# Patient Record
Sex: Female | Born: 1980 | Race: White | Hispanic: No | Marital: Married | Smoking: Former smoker
Health system: Southern US, Community
[De-identification: ages and names within clinical notes are randomized; demographics above are authoritative.]

## PROBLEM LIST (undated history)

## (undated) DIAGNOSIS — I8390 Asymptomatic varicose veins of unspecified lower extremity: Secondary | ICD-10-CM

## (undated) DIAGNOSIS — F329 Major depressive disorder, single episode, unspecified: Secondary | ICD-10-CM

## (undated) DIAGNOSIS — M797 Fibromyalgia: Secondary | ICD-10-CM

## (undated) DIAGNOSIS — R160 Hepatomegaly, not elsewhere classified: Secondary | ICD-10-CM

## (undated) DIAGNOSIS — C801 Malignant (primary) neoplasm, unspecified: Secondary | ICD-10-CM

## (undated) DIAGNOSIS — F319 Bipolar disorder, unspecified: Secondary | ICD-10-CM

## (undated) DIAGNOSIS — F32A Depression, unspecified: Secondary | ICD-10-CM

## (undated) HISTORY — DX: Fibromyalgia: M79.7

## (undated) HISTORY — DX: Hepatomegaly, not elsewhere classified: R16.0

## (undated) HISTORY — PX: BIOPSY THYROID: PRO38

## (undated) HISTORY — DX: Asymptomatic varicose veins of unspecified lower extremity: I83.90

## (undated) HISTORY — DX: Depression, unspecified: F32.A

## (undated) HISTORY — PX: BLADDER SURGERY: SHX569

## (undated) HISTORY — PX: TUBAL LIGATION: SHX77

## (undated) HISTORY — PX: DILATION AND CURETTAGE OF UTERUS: SHX78

## (undated) HISTORY — DX: Major depressive disorder, single episode, unspecified: F32.9

## (undated) SURGERY — Surgical Case
Anesthesia: *Unknown

---

## 2000-11-05 ENCOUNTER — Emergency Department (HOSPITAL_COMMUNITY): Admission: EM | Admit: 2000-11-05 | Discharge: 2000-11-05 | Payer: Self-pay | Admitting: Emergency Medicine

## 2001-01-21 ENCOUNTER — Encounter: Admission: RE | Admit: 2001-01-21 | Discharge: 2001-01-21 | Payer: Self-pay | Admitting: Family Medicine

## 2001-01-21 ENCOUNTER — Encounter: Payer: Self-pay | Admitting: Family Medicine

## 2002-08-10 ENCOUNTER — Other Ambulatory Visit: Admission: RE | Admit: 2002-08-10 | Discharge: 2002-08-10 | Payer: Self-pay | Admitting: *Deleted

## 2002-08-10 ENCOUNTER — Other Ambulatory Visit: Admission: RE | Admit: 2002-08-10 | Discharge: 2002-08-10 | Payer: Self-pay | Admitting: Obstetrics and Gynecology

## 2003-09-03 ENCOUNTER — Other Ambulatory Visit: Admission: RE | Admit: 2003-09-03 | Discharge: 2003-09-03 | Payer: Self-pay | Admitting: Obstetrics and Gynecology

## 2003-10-11 ENCOUNTER — Inpatient Hospital Stay: Admission: AD | Admit: 2003-10-11 | Discharge: 2003-10-11 | Payer: Self-pay | Admitting: Obstetrics and Gynecology

## 2003-10-18 ENCOUNTER — Ambulatory Visit (HOSPITAL_COMMUNITY): Admission: RE | Admit: 2003-10-18 | Discharge: 2003-10-18 | Payer: Self-pay | Admitting: Obstetrics and Gynecology

## 2003-11-26 ENCOUNTER — Encounter (INDEPENDENT_AMBULATORY_CARE_PROVIDER_SITE_OTHER): Payer: Self-pay | Admitting: Specialist

## 2003-11-26 ENCOUNTER — Ambulatory Visit (HOSPITAL_COMMUNITY): Admission: RE | Admit: 2003-11-26 | Discharge: 2003-11-26 | Payer: Self-pay | Admitting: Obstetrics and Gynecology

## 2004-08-11 ENCOUNTER — Inpatient Hospital Stay (HOSPITAL_COMMUNITY): Admission: AD | Admit: 2004-08-11 | Discharge: 2004-08-11 | Payer: Self-pay | Admitting: Obstetrics and Gynecology

## 2004-12-24 ENCOUNTER — Inpatient Hospital Stay (HOSPITAL_COMMUNITY): Admission: AD | Admit: 2004-12-24 | Discharge: 2004-12-24 | Payer: Self-pay | Admitting: Obstetrics and Gynecology

## 2005-01-04 ENCOUNTER — Inpatient Hospital Stay (HOSPITAL_COMMUNITY): Admission: AD | Admit: 2005-01-04 | Discharge: 2005-01-04 | Payer: Self-pay | Admitting: Obstetrics and Gynecology

## 2005-01-08 ENCOUNTER — Inpatient Hospital Stay (HOSPITAL_COMMUNITY): Admission: AD | Admit: 2005-01-08 | Discharge: 2005-01-08 | Payer: Self-pay | Admitting: Obstetrics and Gynecology

## 2005-01-09 ENCOUNTER — Inpatient Hospital Stay (HOSPITAL_COMMUNITY): Admission: AD | Admit: 2005-01-09 | Discharge: 2005-01-13 | Payer: Self-pay | Admitting: Obstetrics and Gynecology

## 2005-01-10 ENCOUNTER — Encounter (INDEPENDENT_AMBULATORY_CARE_PROVIDER_SITE_OTHER): Payer: Self-pay | Admitting: Specialist

## 2005-01-14 ENCOUNTER — Inpatient Hospital Stay (HOSPITAL_COMMUNITY): Admission: AD | Admit: 2005-01-14 | Discharge: 2005-01-14 | Payer: Self-pay | Admitting: Obstetrics and Gynecology

## 2007-03-18 ENCOUNTER — Encounter: Admission: RE | Admit: 2007-03-18 | Discharge: 2007-03-18 | Payer: Self-pay | Admitting: Obstetrics and Gynecology

## 2007-05-12 ENCOUNTER — Encounter (INDEPENDENT_AMBULATORY_CARE_PROVIDER_SITE_OTHER): Payer: Self-pay | Admitting: Obstetrics and Gynecology

## 2007-05-12 ENCOUNTER — Inpatient Hospital Stay (HOSPITAL_COMMUNITY): Admission: AD | Admit: 2007-05-12 | Discharge: 2007-05-15 | Payer: Self-pay | Admitting: Obstetrics and Gynecology

## 2008-11-14 ENCOUNTER — Ambulatory Visit: Payer: Self-pay | Admitting: Vascular Surgery

## 2009-03-01 ENCOUNTER — Ambulatory Visit: Payer: Self-pay | Admitting: Vascular Surgery

## 2009-06-12 ENCOUNTER — Ambulatory Visit: Payer: Self-pay | Admitting: Vascular Surgery

## 2009-06-12 HISTORY — PX: ENDOVENOUS ABLATION SAPHENOUS VEIN W/ LASER: SUR449

## 2009-06-19 ENCOUNTER — Ambulatory Visit: Payer: Self-pay | Admitting: Vascular Surgery

## 2009-07-10 ENCOUNTER — Ambulatory Visit: Payer: Self-pay | Admitting: Vascular Surgery

## 2009-07-10 HISTORY — PX: ENDOVENOUS ABLATION SAPHENOUS VEIN W/ LASER: SUR449

## 2009-07-17 ENCOUNTER — Ambulatory Visit: Payer: Self-pay | Admitting: Vascular Surgery

## 2010-02-23 ENCOUNTER — Encounter: Payer: Self-pay | Admitting: Obstetrics and Gynecology

## 2010-06-17 NOTE — Assessment & Plan Note (Signed)
OFFICE VISIT   Andrea Gallegos  DOB:  03/20/1980                                       03/01/2009  BMWUX#:[id number]   The patient presents today for continued followup of her severe venous  pathology.  She has been seen by myself in October for evaluation of  bilateral lower extremity saphenous vein varicosities.  She has worn  graduated compression garments for 3 months and reports these are giving  her no improvement in her pain.  She also elevates her legs when  possible and also takes ibuprofen t.i.d. as needed for pain as well.  She reports that she works at a job in Sales promotion account executive and requires a prolonged  period of sitting and standing that is very difficult due to leg pain.  She also reports that prolonged standing is difficult and painful with  the care of her 2 small children and also has had to stop cardiac and  weight strength training exercise due to leg pain.   PHYSICAL EXAMINATION:  Physical exam is unchanged.  Blood pressure today  is 127/86, pulse 100, respirations 18.  She does have 2+ dorsalis pedis  pulses bilaterally.  She has marked saphenous vein varicosities in both  medial distal thighs onto her calves.  She had undergone prior duplex  showing gross reflux in her saphenous veins bilaterally.  I have  recommended staged bilateral laser ablation of her great saphenous vein  and stab phlebectomy of her tributary varicosities for relief of her  symptoms.  She reports that the right leg is slightly more severe than  her left, and so we will proceed with this leg first.  She understands  the procedure is an outpatient procedure under local anesthesia.  We  will schedule her at her convenience.     Larina Earthly, M.D.  Electronically Signed   TFE/MEDQ  D:  03/01/2009  T:  03/04/2009  Job:  [id number]   cc:   Loyal Jacobson, M.D.

## 2010-06-17 NOTE — Procedures (Signed)
DUPLEX DEEP VENOUS EXAM - LOWER EXTREMITY   INDICATION:  Follow up left great saphenous vein ablation.   HISTORY:  Edema:  No  Trauma/Surgery:  05/11 left great saphenous vein ablation  Pain:  Yes  PE:  No  Previous DVT:  No  Anticoagulants:  No  Other:   DUPLEX EXAM:                CFV   SFV   PopV  PTV    GSV                R  L  R  L  R  L  R   L  R  L  Thrombosis    0  0     0     0      0     +  Spontaneous   +  +     +     +      +     0  Phasic        +  +     +     +      +     0  Augmentation  +  +     +     +      +     0  Compressible  +  +     +     +      +     0  Competent     +  +     +     +      +     0   Legend:  + - yes  o - no  p - partial  D - decreased   IMPRESSION:  There is no evidence of deep venous thrombosis in the left  leg.  There appears to be thrombus noted in the left great saphenous  vein proximal to the level of the knee.    _____________________________  Janetta Hora Fields, MD   CB/MEDQ  D:  06/20/2009  T:  06/20/2009  Job:  [id number]

## 2010-06-17 NOTE — Op Note (Signed)
NAMELaray Gallegos NO.:  [id number]   MEDICAL RECORD NO.:  [id number]          PATIENT TYPE:  INP   LOCATION:  9198                          [facility]ILITY:  [facility]   PHYSICIAN:  Lenoard Aden, M.D.DATE OF BIRTH:  October 22, 1980   DATE OF PROCEDURE:  05/12/2007  DATE OF DISCHARGE:                               OPERATIVE REPORT   PREOPERATIVE DIAGNOSIS:  38 week intrauterine pregnancy, poorly  controlled gestational diabetes, previous cesarean section, desire for  elective sterilization, polyhydramnios.   POSTOPERATIVE DIAGNOSIS:  38 week intrauterine pregnancy, poorly  controlled gestational diabetes, previous cesarean section, desire for  elective sterilization, polyhydramnios.   PROCEDURE:  Repeat low segment transverse cesarean section and tubal  ligation.   SURGEON:  Lenoard Aden, M.D.   ASSISTANT:  Marlinda Mike, C.N.M.   ANESTHESIA:  Spinal by Dr. Pamalee Leyden.   ESTIMATED BLOOD LOSS:  1000 mL.   COMPLICATIONS:  None.   DRAINS:  Foley.   COUNTS:  Correct.   DISPOSITION:  Patient to the recovery room in good condition.   FINDINGS:  Full term living female, Apgars 8/.  Normal tubes, normal  ovaries.   BRIEF OPERATIVE NOTE:  After being apprised of the risks of anesthesia,  infection, bleeding, injury to abdominal organs, need for repair,  delayed versus immediate complications to include bowel and bladder  injury, the patient was brought to the operating room where she was  administered spinal anesthetic without complications, prepped and draped  in the usual sterile fashion.  A Foley catheter was placed.  After  achieving adequate anesthesia with dilute Marcaine solution, A  Pfannenstiel skin incision was made with the scalpel and carried down to  the fascia which is nicked in the midline and extended transversely  using Mayo scissors.  The rectus muscles were separated in the midline,  the peritoneum entered sharply, and bladder blade placed.  Visceroperitoneum was scored sharply off the lower uterine segment.  A  Kerr hysterotomy incision was made, atraumatic delivery, occiput  transverse position, full term living female handed to the pediatricians  in attendance.  Apgars 8/9, cord blood collected.  Placenta delivered  manually intact. Three vessel cord noted.  The uterus exteriorized,  closed in one running layer of 0 Monocryl suture interrupted placed in  the midline.  The right and left tube traced out to the fimbriated end,  ampullary isthmic portion of tube identified, avascular portion of the  mesosalpinx cauterized creating a window, 0 ties were placed proximally  and distally.  Good hemostasis is noted.  The tubal lumens were  visualized and cauterized.  The tubal segment removed.  The same  procedure done on the left tube is done on the right tube. Tubal  segments are sent to pathology.  Good hemostasis was noted.  The uterine incision was reinspected and  found to be hemostatic.  The bladder flap inspected and found to be  hemostatic.  Irrigation accomplished.  The fascia was closed using 0  Monocryl suture and the skin closed using staples.  The patient  tolerated the procedure well and transferred to recovery  in good  condition.      Lenoard Aden, M.D.  Electronically Signed     RJT/MEDQ  D:  05/12/2007  T:  05/12/2007  Job:  [id number]

## 2010-06-17 NOTE — Procedures (Signed)
DUPLEX DEEP VENOUS EXAM - LOWER EXTREMITY   INDICATION:  Right greater saphenous vein laser procedure and  phlebectomies.   HISTORY:  Edema:  No.  Trauma/Surgery:  On 07/20/09, right greater saphenous vein laser  procedure and phlebectomies.  Pain:  Yes.  PE:  No.  Previous DVT:  No.  Anticoagulants:  No.  Other:   DUPLEX EXAM:                CFV   SFV   PopV  PTV    GSV                R  L  R  L  R  L  R   L  R  L  Thrombosis    o  o  o     o     o  Spontaneous   +  +  +     +     +  Phasic        +  +  +     +     +  Augmentation  +  +  +     +     +  Compressible  +  +  +     +     +  Competent     +  +  +     +     +   Legend:  + - yes  o - no  p - partial  D - decreased   IMPRESSION:  1. The right leg is negative for deep venous thrombosis.  2. Right greater saphenous vein has been lasered closed.  Area of      phlebectomies also appear to be closed.    _____________________________  Larina Earthly, M.D.   NT/MEDQ  D:  07/17/2009  T:  07/17/2009  Job:  [id number]

## 2010-06-17 NOTE — Assessment & Plan Note (Signed)
OFFICE VISIT   Andrea Gallegos A  DOB:  1980/07/22                                       07/10/2009  EAVWU#:[id number]   The patient presents today for right leg great saphenous vein laser  ablation and stab phlebectomy of multiple tributaries through her thigh  and calf.  She had undergone a similar treatment in her left leg on  05/11.  She had no complications following the procedure and is here for  her planned staged second leg.  She underwent the procedure without  immediate complication and was discharged home.  She will be seen in 1  week for repeat duplex to rule out any evidence of DVT and to confirm  ablation of her saphenous vein.     Larina Earthly, M.D.  Electronically Signed   TFE/MEDQ  D:  07/10/2009  T:  07/11/2009  Job:  [id number]   cc:   Dr Loyal Jacobson

## 2010-06-17 NOTE — Procedures (Signed)
LOWER EXTREMITY VENOUS REFLUX EXAM   INDICATION:  Bilateral lower extremity swelling and pain.   EXAM:  Using color-flow imaging and pulse Doppler spectral analysis, the  bilateral common femoral, superficial femoral, popliteal, posterior  tibial, greater and lesser saphenous veins are evaluated.  There is not  evidence suggesting deep venous insufficiency in the bilateral lower  extremities.   The bilateral saphenofemoral junction is not competent with reflux of  >500 milliseconds.  The bilateral GSV is not competent with reflux of  >500 milliseconds, with the caliber as described below.   The bilateral proximal short saphenous vein demonstrates incompetency.   GSV Diameter (used if found to be incompetent only)                                            Right    Left  Proximal Greater Saphenous Vein           1.09 cm  1.31 cm  Proximal-to-mid-thigh                     0.62 cm  0.64 cm  Mid thigh                                 0.57 cm  0.93 cm  Mid-distal thigh                          cm       cm  Distal thigh                              0.69 cm  0.66 cm  Knee                                      0.49 cm  0.76 cm   IMPRESSION:  1. The bilateral greater saphenous vein reflux with >500 milliseconds      is identified with the caliber ranging from 1.09 cm to 0.49 cm on      the right and 1.31 cm to 0.76 cm on the left from the knee to      groin.  2. The bilateral greater saphenous veins are not aneurysmal.  3. The bilateral greater saphenous veins are not tortuous.  4. The deep venous system is competent.  5. The bilateral lesser saphenous veins are not competent with reflux      of >500 milliseconds, with the right extending into a cranial vein,      which becomes a tortuous varicosity.  6. Incompetent perforator veins on the right medial measuring 0.29.      On the left medial, 0.31cm and posterior 0.38 cm.        ___________________________________________  Larina Earthly, M.D.   CJ/MEDQ  D:  11/14/2008  T:  11/14/2008  Job:  [id number]

## 2010-06-17 NOTE — Consult Note (Signed)
NEW PATIENT CONSULTATION   Andrea Gallegos A  DOB:  May 25, 1980                                       11/14/2008  [id number]#:[id number]   The patient presents today for evaluation of bilateral venous  varicosities and venous hypertension.  She is a very pleasant 30-year-  old white female with progressive changes of marked saphenous vein  tributary varicosities in both lower extremities.  She reports that  these began occurring around the time of her initial pregnancy and has  been progressive over the last several years.  She reports bilateral  pain.  This occurs with standing and with walking.  She reports that  this is somewhat worse on the left leg than on the right leg with a  generalized aching throughout this area.  She also has pain specifically  over the varicosities themselves.  She does not have any history of deep  venous thrombosis, superficial thrombophlebitis or bleeding.   PAST MEDICAL HISTORY:  Significant for gestational diabetes, does have  history of melanoma.   SOCIAL HISTORY:  She is married with 2 children.  She does smoke 1 pack  of cigarettes per day and does have several alcohol drinks per week.   REVIEW OF SYSTEMS:  Her weight is 215 pounds.  She is 5 feet 7 inches  tall.  Her review of systems is negative for cardiac, pulmonary, GI or  GU difficulties.  She does have pain with walking in her legs related to  the varicosities.  She has no neuro disorders.   PHYSICAL EXAM:  A well-developed, well-nourished white female appearing  stated age of 36.  Her radial and dorsalis pedis pulses are 2+  bilaterally.  Blood pressure is 123/30, heart rate is 82, respirations  18.  Her lower extremities are noted for marked saphenous vein  varicosities in her medial thigh and medial calf.  These are quite large  bilaterally.   She underwent noninvasive vascular laboratory studies in our office.  This reveals no evidence of DVT and no evidence of deep  vein reflux.  She does have marked reflux in her great and small saphenous veins  bilaterally.  I discussed this at length with the patient.  It appears  that her painful varicosities are arising off her great saphenous and  not from her small saphenous although there are some small  communications.  She had worn compression garments in the past but has  not worn these recently.  We have fitted her today for thigh-high 20-30  mmHg graduated compression stockings and instructed her on their use.  We will see her back in 3 months to determine her result from this  treatment.  I did discuss the option of laser ablation and stab  phlebectomy for relief of symptoms should this not be treated  effectively with compression.  She will see [place] in 3 months.   Larina Earthly, M.D.  Electronically Signed   TFE/MEDQ  D:  11/14/2008  T:  11/15/2008  Job:  [id number]   cc:   Dr Loyal Jacobson

## 2010-06-17 NOTE — Assessment & Plan Note (Signed)
OFFICE VISIT   Andrea Gallegos  DOB:  June 01, 1980                                       06/12/2009  ZOXWR#:[id number]   The patient presents today for the first of her staged bilateral laser  ablation and stab phlebectomy for painful varicosities of her lower  extremities.  She reports that her left leg is somewhat more sensitive  than the right and therefore we proceeded with the left leg laser  ablation of her great saphenous vein from just below her knee to her  saphenofemoral junction and stab phlebectomy and multiple tributary  varicosities in her thigh, knee region and calf.  She had no immediate  complications and will be seen again in 1 week with repeat ultrasound.  Following recovery of the discomfort from this procedure she will have  staged similar treatment in her right leg.     Larina Earthly, M.D.  Electronically Signed   TFE/MEDQ  D:  06/12/2009  T:  06/13/2009  Job:  [id number]

## 2010-06-20 NOTE — Discharge Summary (Signed)
NAMELaray Gallegos NO.:  [id number]   MEDICAL RECORD NO.:  [id number]          PATIENT TYPE:  INP   LOCATION:  9108                          FACILITY:  [facility]   PHYSICIAN:  Rudy Jew. Ashley Royalty, M.D.DATE OF BIRTH:  December 09, 1980   DATE OF ADMISSION:  01/09/2005  DATE OF DISCHARGE:  01/13/2005                                 DISCHARGE SUMMARY   DISCHARGE DIAGNOSES:  1.  Intrauterine pregnancy at [redacted] weeks gestation, delivered.  2.  History of genital herpes.  3.  History of melanoma.  4.  History of migraines.  5.  Anxiety disorder.  6.  Term birth living child, vertex.  7.  Meconium stained amniotic fluid.  8.  Arrest disorder of descent.   OPERATIONS AND SPECIAL PROCEDURES:  Primary low-transverse cesarean section.   CONSULTATIONS:  None.   DISCHARGE MEDICATIONS:  1.  Percocet.  2.  Motrin 600 mg q.i.d.  3.  Chromagen.  4.  Aristocort 0.5%.   HISTORY AND PHYSICAL:  This is a 30 year old gravida 2, para 0, AB 1, EDC  January 02, 2005 at [redacted] weeks gestation.  Prenatal care was initially at  [facility].  The patient switched to [facility]  approximately July 17, 2004.  Risk factors during the pregnancy included  genital herpes, migraine headaches, history of melanoma.  The patient was  admitted for induction secondary to post dates gestation.  For the remainder  of the history and physical, please see chart.   HOSPITAL COURSE:  The patient was admitted to [facility].  Admission laboratory studies were drawn.  The patient went on  to labor on January 10, 2005.  She developed an arrest disorder of descent.  Hence, she was taken to the operating room and underwent a primary low-  transverse cesarean section.  The procedure yielded a 7 pound 15 ounce female,  Apgars 9 at one minute and 9 at five minutes, and was sent to the newborn  nursery.  Arterial cord pH was 7.32.  Delivery was accomplished by Dr. Sylvester Harder.  The patient's postpartum course was benign, save for an  asymptomatic anemia.  She was discharged home on the aforementioned  medications on January 13, 2005 afebrile and in satisfactory condition.   The patient is to return to [facility] in 4 to 6  weeks for postpartum evaluation.  The patient was discharged home.      James A. Ashley Royalty, M.D.  Electronically Signed     JAM/MEDQ  D:  03/11/2005  T:  03/11/2005  Job:  [id number]

## 2010-06-20 NOTE — H&P (Signed)
NAMELaray Gallegos NO.:  [id number]   MEDICAL RECORD NO.:  [id number]          PATIENT TYPE:  AMB   LOCATION:  SDC                           [facility]ILITY[facility]  [facility]   PHYSICIAN:  Andrea Gallegos, M.D.   DATE OF BIRTH:  12/13/1980   DATE OF ADMISSION:  DATE OF DISCHARGE:                                HISTORY & PHYSICAL   PREOPERATIVE DIAGNOSIS:  Missed abortion.   HISTORY OF PRESENT ILLNESS:  This is a 30 year old gravida 1, para 0, white  female with a due date of Jun 17, 2004 established by 7-week ultrasound who  presented to the office at [redacted] weeks gestation for a routine OB visit and was  found to have a nonviable intra-uterine pregnancy.  The patient presents  today for surgical management of missed AB.  She denies any vaginal bleeding  or cramping.   PAST MEDICAL HISTORY:  1.  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  2.  History of migraine.  3.  History of recurrent urinary tract infection.  4.  Anxiety.   GYNECOLOGIC HISTORY:  Positive history of HSV, positive abnormal Pap smear  but no surgical intervention needed.   SURGICAL HISTORY:  Bladder surgery in 1987.   OBSTETRIC HISTORY:  Gravida 1, para 0.  The patient had marine IUD placed in  August of 2005, spontaneously conceived.  IUD was removed September of 2005.   FAMILY HISTORY:  Noncontributory.  No history of congenital anomalies,  bleeding or clotting disorders.  Positive history of skin cancer.   PHYSICAL EXAMINATION:  VITAL SIGNS: Afebrile.  Vital signs are stable.  GENERAL:  She is a well-developed, well-nourished white female in no  apparent distress.  HEART:  A regular rate and rhythm.  LUNGS:  Clear to auscultation bilaterally.  ABDOMEN:  Soft, nontender, nondistended with no palpable masses.  BREASTS:  Symmetric, nontender.  No masses.  No lymphadenopathy.  No  discharge from the nipple.  SKIN:  She has three scars on her chest from her recent mole excision.  PELVIC:  External genitalia are within normal limits.  Bladder and urethra  within normal limits.  Vagina is pink, moist and rugated.  Cervix is without  lesions.  No cervical motion tenderness, is visibly closed.  Bimanual exam:  Uterus is anteverted about 8-10 weeks in size, nontender.  Adnexa are  nontender with no palpable masses.   ULTRASOUND:  Ultrasound on October 31, 2003 revealed at that time a viable  intra-uterine pregnancy at 7-weeks gestation.  Ultrasound performed on  November 22, 2003 when the patient should have been 10-weeks gestation  revealed a fetal pole measuring at only 8+ weeks gestation with no cardiac  activity identified.  Her blood type is O+, antibody screen negative.   ASSESSMENT:  A 30 year old gravida 1, para 0, white female with first  trimester missed AB, desires surgical management.   PLAN:  The patient has been counseled on the risks, benefits, and  alternatives of D&E, reviewing the risk of bleeding requiring transfusion,  infection which could cause infertility, injury to uterus or uterine  perforation which could require further surgery and cause complications with  pregnancy in the future or infertility, anesthetic-related complications and  the patient voices an understanding of all these risks and agrees to  proceed.  All questions have been answered and consent has been obtained.      JW/MEDQ  D:  11/25/2003  T:  11/25/2003  Job:  [id number]

## 2010-06-20 NOTE — H&P (Signed)
NAMELaray Gallegos NO.:  [id number]   MEDICAL RECORD NO.:  [id number]          PATIENT TYPE:  INP   LOCATION:  NA                            [facility]   PHYSICIAN:  James A. Ashley Royalty, M.D.DATE OF BIRTH:  1980-02-03   DATE OF ADMISSION:  01/09/2005  DATE OF DISCHARGE:                                HISTORY & PHYSICAL   Please send this dictation to labor and delivery at [facility], as the  patient is coming in at approximately 9 p.m. this evening.   This is a 30 year old gravida 2, para 0, AB 1, EDC January 02, 2005, 41  weeks by dates.  Prenatal care was initially at [facility].  The  patient switched to [facility] and [facility]s approximately July 17, 2004.  Risk factors during this pregnancy include genital herpes  (confidential), migraine headaches, history of melanoma.  The patient is  admitted for induction secondary to postdates gestation.   MEDICATIONS:  1.  Zoloft 25 mg daily.  2.  Prenatal vitamins.  3.  Valtrex 500 mg daily.   PAST MEDICAL HISTORY:  As above.   SURGICAL HISTORY:  Negative.   ALLERGIES:  Negative.   REVIEW OF SYSTEMS:  Noncontributory.   PHYSICAL EXAMINATION:  GENERAL:  Well-developed, well-nourished, pleasant,  white female in no acute distress.  VITAL SIGNS:  Afebrile, vital signs stable.  CHEST:  Lungs are clear.  CARDIAC:  Regular rate and rhythm.  ABDOMEN:  Gravid with a term fundal height.  Fetal heart tones are  auscultated.  MUSCULOSKELETAL:  No CVA tenderness.  PELVIC:  Deferred until labor and delivery admission.   IMPRESSION:  1.  Intrauterine pregnancy at [redacted] weeks gestation.  2.  History of genital herpes (culture proven) - confidential.  3.  History of melanoma.  4.  History of migraines.  5.  Anxiety disorder.   PLAN:  1.  Admit.  2.  Induction of labor.      James A. Ashley Royalty, M.D.  Electronically Signed     JAM/MEDQ  D:  01/09/2005  T:  01/09/2005  Job:  [id number]

## 2010-06-20 NOTE — Op Note (Signed)
NAMELaray Gallegos NO.:  [id number]   MEDICAL RECORD NO.:  [id number]          PATIENT TYPE:  INP   LOCATION:  9108                          FACILITY:  [facility]   PHYSICIAN:  Andrea Gallegos, M.D.DATE OF BIRTH:  01-15-1981   DATE OF PROCEDURE:  01/10/2005  DATE OF DISCHARGE:                                 OPERATIVE REPORT   PREOPERATIVE DIAGNOSIS:  1.  Intrauterine pregnancy at [redacted] weeks gestation.  2.  Arrest disorder of descent in labor.  3.  Meconium-stained amniotic fluid.   POSTOPERATIVE DIAGNOSES:  1.  Intrauterine pregnancy at [redacted] weeks gestation.  2.  Arrest disorder of descent in labor.  3.  Meconium-stained amniotic fluid.   PROCEDURE:  Primary low transverse cesarean section.   SURGEON:  Andrea Gallegos, M.D.   ANESTHESIA:  Epidural   FINDINGS:  7 pounds 15 ounces female Apgars 9 at 1-minute, 9 at 5 minutes sent  to the newborn nursery. Arterial cord pH 7.32.   ESTIMATED BLOOD LOSS:  600 mL.   COMPLICATIONS:  None.   PACKS DRAINS:  Foley.   Needle, sponge, and instrument counts correct x2.   PROCEDURE:  The patient is taken to the operating room, placed in the  dorsosupine position. She was prepped and draped in usual manner for  abdominal surgery. Foley catheter had been previously placed. Surgical  levels of anesthesia were verified.   The Pfannenstiel incision was made at the level of the fascia. The fascia  was nicked with a knife, incised transversely with Mayo scissors. The  underlying rectus muscles were separated from the fascia using sharp and  blunt dissection. The rectus muscles were separated in midline exposing  peritoneum which was elevated with hemostats and entered atraumatically with  Metzenbaum scissors. The incision was extended longitudinally. The  peritoneum was identified, elevated with hemostats and entered  atraumatically with Metzenbaum scissors. The incision was extended  longitudinally. The uterus was  identified and a bladder flap created by  incising the anterior uterine serosa and sharply and bluntly dissecting the  bladder inferiorly. It was held in place with bladder blade. The uterus was  then entered through a low transverse incision using sharp and blunt  dissection. Meconium-stained amniotic fluid was noted. The infant was noted  to be occiput posterior. At delivery of the head, DeLee suction was used to  suction the oral and nasopharynx. The remainder of the delivery was  accomplished. The cord was triply clamped, cut and the infant given  immediately to the awaiting pediatrics team. Arterial cord pH was obtained  isolated segment. Regular cord blood was obtained as well. Placenta and  membranes were removed in their entirety and submitted to pathology for  histologic studies. The uterus was exteriorized. The uterus was then closed  in two running layers of #1 Vicryl. The first was a running locking layer.  The second was a running, intermittently locking, and imbricating layer. One  additional figure-of-eight suture was required to obtain hemostasis.  Hemostasis was noted. Uterus, tubes and ovaries were inspected and found to  be normal and returned  to the abdominal cavity. Copious irrigation was  accomplished. The peritoneum was then closed with 3-0 Vicryl in running  fashion. The fascia was closed 0 Vicryl in running fashion. The skin was  closed with staples. The patient tolerated procedure extremely well.  At the  conclusion of the procedure the urine was clear and copious. The patient was  transferred to the recovery room in excellent condition.      Andrea Gallegos, M.D.  Electronically Signed     JAM/MEDQ  D:  01/11/2005  T:  01/11/2005  Job:  [id number]

## 2010-06-20 NOTE — Op Note (Signed)
NAMELaray Gallegos NO.:  [id number]   MEDICAL RECORD NO.:  [id number]          PATIENT TYPE:  AMB   LOCATION:  SDC                           FACILITY:  [facility]   PHYSICIAN:  Richardean Sale, M.D.   DATE OF BIRTH:  1980-08-29   DATE OF PROCEDURE:  11/26/2003  DATE OF DISCHARGE:                                 OPERATIVE REPORT   PREOPERATIVE DIAGNOSIS:  Missed abortion.   POSTOPERATIVE DIAGNOSIS:  Missed abortion.   PROCEDURE:  Dilatation and evacuation.   SURGEON:  Richardean Sale, M.D.   ANESTHESIA:  Conscious sedation with paracervical block.   ESTIMATED BLOOD LOSS:  Less than 100 mL.   FINDINGS:  Moderate amount of products of conception.   SPECIMENS:  Products of conception to pathology.   COMPLICATIONS:  None.   INDICATIONS:  This is a 30 year old gravida 1, para 0, white female, who was  found to have an eight-week nonviable intrauterine pregnancy on ultrasound.  The patient is without any bleeding or cramping, and it is elected to  proceed with surgical management after being counseled on different options  for management.  Prior to the procedure the risks of the procedure,  including hemorrhage requiring transfusion, injury to the uterus which could  require additional surgery and perhaps impact her fertility in the future,  infection which could prolong hospitalization and impact infertility in the  future, and associated related complications.  The patient voices  understanding of all these risks and agrees to proceed.  Informed consent  obtained before proceeding to the OR.   PROCEDURE:  The patient was taken to the operating room, where she was given  intravenous sedation.  She was then placed in the dorsal lithotomy position  and was prepped with a Betadine prep.  A red rubber catheter was then used  to drain the bladder.  Approximately 100 mL of clear urine was noted.  Bimanual exam was performed, which confirmed the presence of an eight to  10-  week size uterus, anteverted in the midline, with no palpable masses in the  adnexa.  A speculum was then placed into the vagina and the cervix was  easily visualized.  Nesacaine 1% 2 mL were then injected at the 12 o'clock  position and the anterior lip of the cervix was then grasped with a single-  tooth tenaculum.  The paracervical block was then administered using a total  of 20 mL of 1% Nesacaine.  The cervix was then dilated with the Hegar  dilators.  A #8 suction curette was then introduced and suction was applied.  A moderate amount of products of conception was removed.  Once suction was  complete, this was followed by sharp curettage until a gritty texture was  noted in all four quadrants.  Specimens were then sent to pathology labeled  as products of conception.  There was minimal bleeding from the cervix.  The single-tooth tenaculum was removed from the cervix with minimal bleeding  from the tenaculum site.  The speculum was removed.  Bimanual exam was  performed, and the uterus was now less  than eight weeks' size in the  midline.  No obvious masses.  Adnexa normal.  The patient tolerated the procedure well.  Sponge, lap, needle, and  instrument counts were all correct x2.  She was taken out of the dorsal  lithotomy position, awakened from her sedation, and was transferred to the  recovery room awake and in stable condition.  There were no complications.      JW/MEDQ  D:  11/26/2003  T:  11/26/2003  Job:  [id number]

## 2010-06-20 NOTE — Discharge Summary (Signed)
NAMELaray Gallegos NO.:  [id number]   MEDICAL RECORD NO.:  [id number]          PATIENT TYPE:  INP   LOCATION:  9144                          [facility]ACILITY:  [facility]   PHYSICIAN:  Lenoard Aden, M.D.DATE OF BIRTH:  [birth year]   DATE OF ADMISSION:  05/12/2007  DATE OF DISCHARGE:  05/15/2007                               DISCHARGE SUMMARY   The patient underwent uncomplicated repeat C-section on May 12, 2007.  Postoperative course uncomplicated.  Tolerated regular diet well and  ambulated without difficulty.  Discharged to home on day #3.  Discharge  teaching done.  Tylox, prenatal vitamins, and iron were given.  Follow  up in the hospital in 4-6 weeks.      Lenoard Aden, M.D.  Electronically Signed     RJT/MEDQ  D:  06/05/2007  T:  06/06/2007  Job:  [id number]

## 2010-10-28 LAB — CBC
MCHC: 34.3
MCV: 77.6 — ABNORMAL LOW
Platelets: 191
Platelets: 238
RDW: 16 — ABNORMAL HIGH

## 2010-10-28 LAB — RPR: RPR Ser Ql: NONREACTIVE

## 2011-01-01 ENCOUNTER — Encounter: Payer: Self-pay | Admitting: Cardiology

## 2011-01-02 ENCOUNTER — Encounter: Payer: Self-pay | Admitting: *Deleted

## 2011-01-02 ENCOUNTER — Ambulatory Visit (INDEPENDENT_AMBULATORY_CARE_PROVIDER_SITE_OTHER): Payer: BC Managed Care – PPO | Admitting: Cardiology

## 2011-01-02 VITALS — BP 124/82 | HR 97 | Ht 67.5 in | Wt 203.0 lb

## 2011-01-02 DIAGNOSIS — R002 Palpitations: Secondary | ICD-10-CM

## 2011-01-02 DIAGNOSIS — R079 Chest pain, unspecified: Secondary | ICD-10-CM

## 2011-01-02 DIAGNOSIS — F17200 Nicotine dependence, unspecified, uncomplicated: Secondary | ICD-10-CM

## 2011-01-02 DIAGNOSIS — IMO0001 Reserved for inherently not codable concepts without codable children: Secondary | ICD-10-CM

## 2011-01-02 NOTE — Patient Instructions (Signed)
Your physician has recommended that you wear a holter monitor. Holter monitors are medical devices that record the heart's electrical activity. Doctors most often use these monitors to diagnose arrhythmias. Arrhythmias are problems with the speed or rhythm of the heartbeat. The monitor is a small, portable device. You can wear one while you do your normal daily activities. This is usually used to diagnose what is causing palpitations/syncope (passing out). 24 hour  Your physician recommends that you schedule a follow-up appointment as needed with Dr Shirlee Latch.

## 2011-01-04 DIAGNOSIS — R079 Chest pain, unspecified: Secondary | ICD-10-CM | POA: Insufficient documentation

## 2011-01-04 DIAGNOSIS — F17200 Nicotine dependence, unspecified, uncomplicated: Secondary | ICD-10-CM | POA: Insufficient documentation

## 2011-01-04 DIAGNOSIS — R002 Palpitations: Secondary | ICD-10-CM | POA: Insufficient documentation

## 2011-01-04 NOTE — Assessment & Plan Note (Signed)
I strongly encouraged her to quit smoking.

## 2011-01-04 NOTE — Progress Notes (Signed)
PCP: Dr. Leonette Most  30 presents for evaluation of tachycardia and chest tightness.  Patient had an episode a few weeks ago where her chest felt tight in the center and she had tingling in her left arm and leg, no weakness.  She also felt like she was slurring her speech.  This episode was the worst, but she has had other episodes of similar chest tightness and left arm/leg tingling.  Her heart rate feels like it is high during the episodes.  She has a history of tachycardia and frequently feels her heart race.  No lightheadedness or syncope.  She has had panic attacks and states that these spells do not feel like prior panic attacks.  She has no exercise intolerance: no exertional dyspnea or exertional chest pain.  The chest pain episodes have no trigger.   ECG: NSR, normal  PMH: 1. H/o panic attacks 2. Tachycardia  SH: Divorced with 1 child.  Lives in [place], works for a [employer].  Smokes 1 ppd.  Rare ETOH, no drugs.    FH: No cardiac disease that she knows of.   ROS: All systems reviewed and negative except as per HPI.   Current Outpatient Prescriptions  Medication Sig Dispense Refill  . QUEtiapine Fumarate (SEROQUEL XR) 150 MG 24 hr tablet Take 150 mg by mouth at bedtime.        Marland Kitchen tiZANidine (ZANAFLEX) 4 MG capsule Take 8 mg by mouth as needed.          BP 124/82  Pulse 97  Ht 5' 7.5" (1.715 m)  Wt 92.08 kg (203 lb)  BMI 31.32 kg/m2 General: NAD Neck: No JVD, no thyromegaly or thyroid nodule.  Lungs: Clear to auscultation bilaterally with normal respiratory effort. CV: Nondisplaced PMI.  Heart regular S1/S2, no S3/S4, no murmur.  No peripheral edema.  No carotid bruit.  Normal pedal pulses.  Abdomen: Soft, nontender, no hepatosplenomegaly, no distention.  Skin: Intact without lesions or rashes.  Neurologic: Alert and oriented x 3.  Psych: Normal affect. Extremities: No clubbing or cyanosis.  HEENT: Normal.

## 2011-01-04 NOTE — Assessment & Plan Note (Signed)
Atypical chest pain.  Most likely cardiac cause would be an arrhythmia (getting holter as above).  If the holter is unremarkable and she continues to have symptoms, could do an echo for completeness.

## 2011-01-04 NOTE — Assessment & Plan Note (Signed)
Patient's heart feels like it races during chest pain episodes.  I will get a holter monitor to assess for arrhythmias.

## 2011-01-06 ENCOUNTER — Telehealth: Payer: Self-pay | Admitting: Cardiology

## 2011-01-06 NOTE — Telephone Encounter (Signed)
Walk in pt Form " Pr Dropped Off Note for Dr.McLean " sent to [facility]  01/06/11/km

## 2011-01-07 ENCOUNTER — Telehealth: Payer: Self-pay | Admitting: Cardiology

## 2011-01-07 NOTE — Telephone Encounter (Signed)
Pt given monitor results done 01/02/11  reviewed by Dr Shirlee Latch 01/07/11. No worrisome findings. Original monitor to Andrea Gallegos

## 2011-01-07 NOTE — Telephone Encounter (Signed)
Pt would like results of holter

## 2011-11-02 ENCOUNTER — Other Ambulatory Visit: Payer: Self-pay | Admitting: Dermatology

## 2011-11-05 ENCOUNTER — Other Ambulatory Visit: Payer: Self-pay | Admitting: Gastroenterology

## 2011-11-05 DIAGNOSIS — R1013 Epigastric pain: Secondary | ICD-10-CM

## 2011-11-05 DIAGNOSIS — R110 Nausea: Secondary | ICD-10-CM

## 2011-11-19 ENCOUNTER — Encounter (HOSPITAL_COMMUNITY)
Admission: RE | Admit: 2011-11-19 | Discharge: 2011-11-19 | Disposition: A | Discharge status: Home / Self Care | Payer: BC Managed Care – PPO | Source: Ambulatory Visit | Attending: Gastroenterology | Admitting: Gastroenterology

## 2011-11-19 ENCOUNTER — Ambulatory Visit (HOSPITAL_COMMUNITY)
Admission: RE | Admit: 2011-11-19 | Discharge: 2011-11-19 | Disposition: A | Discharge status: Home / Self Care | Payer: BC Managed Care – PPO | Source: Ambulatory Visit | Attending: Gastroenterology | Admitting: Gastroenterology

## 2011-11-19 DIAGNOSIS — R110 Nausea: Secondary | ICD-10-CM | POA: Insufficient documentation

## 2011-11-19 DIAGNOSIS — R1013 Epigastric pain: Secondary | ICD-10-CM | POA: Insufficient documentation

## 2011-11-19 MED ORDER — SINCALIDE 5 MCG IJ SOLR
0.0200 ug/kg | Freq: Once | INTRAMUSCULAR | Status: AC
  Administered 2011-11-19: 1.9 ug via INTRAVENOUS

## 2011-11-19 MED ORDER — SINCALIDE 5 MCG IJ SOLR
INTRAMUSCULAR | Status: AC
  Filled 2011-11-19: qty 5

## 2011-11-19 MED ORDER — TECHNETIUM TC 99M MEBROFENIN IV KIT
5.0000 | PACK | Freq: Once | INTRAVENOUS | Status: AC | PRN
  Administered 2011-11-19: 5 via INTRAVENOUS

## 2011-11-25 ENCOUNTER — Other Ambulatory Visit: Payer: Self-pay | Admitting: Gastroenterology

## 2011-11-25 DIAGNOSIS — R110 Nausea: Secondary | ICD-10-CM

## 2011-11-25 DIAGNOSIS — R1033 Periumbilical pain: Secondary | ICD-10-CM

## 2011-11-30 ENCOUNTER — Ambulatory Visit (HOSPITAL_COMMUNITY): Payer: BC Managed Care – PPO

## 2012-05-04 ENCOUNTER — Other Ambulatory Visit: Payer: Self-pay | Admitting: *Deleted

## 2012-05-04 ENCOUNTER — Telehealth: Payer: Self-pay | Admitting: Vascular Surgery

## 2012-05-04 DIAGNOSIS — M7989 Other specified soft tissue disorders: Secondary | ICD-10-CM

## 2012-05-04 DIAGNOSIS — M79609 Pain in unspecified limb: Secondary | ICD-10-CM

## 2012-05-04 NOTE — Telephone Encounter (Addendum)
05/04/12: received voicemail message from patient on 05/04/12 stating that she has been experiencing increased pain and swelling, and difficulty walking due to pain. She is status post ablation and stabs of 07/2009. Would like to be reevaluated, call [phone].  05/04/12 @ 9:18am- returned call to patient # [phone]. No answer, left message with my name and phone number. I explained that she may need to speak with our vein nurse, but to please return my call so that I may assist her.  Will route this to the vein pool in the interim for advice. Will wait for patient call. Dpm  05/04/12 @ 9:43am- pt returned call- I spoke with Sonya and the decision was made to get pt in as soon as we can for a Venous Reflux LTD of left leg and see TFE. Pt is aware. She asked if she should be worried, and I explained that without evaluating her, we really could not give a diagnosis, but if she feels that it is more urgent than next week, she should be evaluated by the ED. She understands and will go if she feels her symptoms are worse. dpm

## 2012-05-09 ENCOUNTER — Encounter: Payer: Self-pay | Admitting: Vascular Surgery

## 2012-05-10 ENCOUNTER — Encounter (INDEPENDENT_AMBULATORY_CARE_PROVIDER_SITE_OTHER): Payer: BC Managed Care – PPO

## 2012-05-10 ENCOUNTER — Ambulatory Visit (INDEPENDENT_AMBULATORY_CARE_PROVIDER_SITE_OTHER): Payer: BC Managed Care – PPO | Admitting: Vascular Surgery

## 2012-05-10 ENCOUNTER — Encounter: Payer: Self-pay | Admitting: Vascular Surgery

## 2012-05-10 VITALS — BP 133/78 | HR 88 | Ht 67.5 in | Wt 192.4 lb

## 2012-05-10 DIAGNOSIS — M79609 Pain in unspecified limb: Secondary | ICD-10-CM | POA: Insufficient documentation

## 2012-05-10 DIAGNOSIS — I872 Venous insufficiency (chronic) (peripheral): Secondary | ICD-10-CM

## 2012-05-10 DIAGNOSIS — M7989 Other specified soft tissue disorders: Secondary | ICD-10-CM

## 2012-05-10 NOTE — Progress Notes (Signed)
Patient presents today for concern regarding left leg pain. She is known to me from a prior bilateral staged laser ablation of her great saphenous vein in May and June of 2011. She also had stab phlebectomy of tributary varicosities at that time. She presents today with the concern regarding severe aching and discomfort in her left leg. No symptoms on the right leg. She reports that this occurs with standing and with sitting. She does have severe aching and bothers her at night and quickly in her calf. She reports it feels that she has been "beaten with a bat". She does not note any significant swelling left versus right. She has had no DVT and no bleeding. She's had no recurrence of her varicosities. She has not been wearing compression garments.  Past Medical History  Diagnosis Date  . Varicose veins     History  Substance Use Topics  . Smoking status: Current Some Day Smoker  . Smokeless tobacco: Never Used     Comment: pt states that she is using e-cigs no tabacco since 02/2012  . Alcohol Use: No    Family History  Problem Relation Age of Onset  . Cancer Mother     squamous cell  . Cancer Paternal Uncle   . Cancer Maternal Grandfather   . Diabetes Maternal Grandfather   . Cancer Paternal Grandmother   . Cancer Paternal Grandfather     Allergies  Allergen Reactions  . Neosporin (Neomycin-Polymyxin-Gramicidin)     Current outpatient prescriptions:Linaclotide (LINZESS) 145 MCG CAPS, Take 145 mcg by mouth daily., Disp: , Rfl: ;  QUEtiapine Fumarate (SEROQUEL XR) 150 MG 24 hr tablet, Take 300 mg by mouth at bedtime. , Disp: , Rfl: ;  tiZANidine (ZANAFLEX) 4 MG capsule, Take 8 mg by mouth as needed.  , Disp: , Rfl:   BP 133/78  Pulse 88  Ht 5' 7.5" (1.715 m)  Wt 192 lb 6.4 oz (87.272 kg)  BMI 29.67 kg/m2  SpO2 100%  Body mass index is 29.67 kg/(m^2).       Physical exam well-developed well-nourished white female no acute distress Respirations are nonlabored 2+ dorsalis  pedis pulses bilaterally. And without ulcers or rashes. Mild scattered telangiectasia but no varicosities bilaterally  She underwent a left leg venous duplex. 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 She does have an anterior accessory branch arising off the saphenofemoral junction. This does show reflux. This does go into several different branches to the level of the knee.  I discussed the significance of this with the patient. I explained that this is causing some superficial venous reflux but doubt that it is causing any severity of discomfort that she is having. I explained the only option would be a treatment of this vein to see if she had improvement but certainly were locked with recommend conservative treatment currently. I feel be unlikely for acute traumatic symptom relief from her current complaints with ablation of his anterior branch. I have suggested that she seek orthopedic evaluation to determine if this can be musculoskeletal. She does understand the importance of elevation and suggested consider a resumption of compression to see if this gives any improvement as well. She was the skin an as-needed basis

## 2012-06-29 ENCOUNTER — Emergency Department (HOSPITAL_COMMUNITY)
Admission: EM | Admit: 2012-06-29 | Discharge: 2012-06-29 | Disposition: A | Discharge status: Home / Self Care | Payer: BC Managed Care – PPO | Attending: Emergency Medicine | Admitting: Emergency Medicine

## 2012-06-29 ENCOUNTER — Encounter (HOSPITAL_COMMUNITY): Payer: Self-pay

## 2012-06-29 DIAGNOSIS — R609 Edema, unspecified: Secondary | ICD-10-CM | POA: Insufficient documentation

## 2012-06-29 DIAGNOSIS — F17200 Nicotine dependence, unspecified, uncomplicated: Secondary | ICD-10-CM | POA: Insufficient documentation

## 2012-06-29 DIAGNOSIS — Z79899 Other long term (current) drug therapy: Secondary | ICD-10-CM | POA: Insufficient documentation

## 2012-06-29 DIAGNOSIS — R600 Localized edema: Secondary | ICD-10-CM

## 2012-06-29 DIAGNOSIS — Z8679 Personal history of other diseases of the circulatory system: Secondary | ICD-10-CM | POA: Insufficient documentation

## 2012-06-29 NOTE — ED Provider Notes (Signed)
History     CSN: [id number]  Arrival date & time 06/29/12  2155   First MD Initiated Contact with Patient 06/29/12 2246      Chief Complaint  Patient presents with  . Leg Swelling   HPI  History provided by the patient. Patient is a 32 year old female with no significant PMH who presents with concerns for lower extremity swelling especially in the right leg and foot. The patient first began to note some tightness and irritation in her right foot and shoe earlier in the day. This evening while taking a shower she noticed her right foot and ankles seemed much more swollen than her left foot and ankle. She also complains of mild discomforts to her legs and feet. Patient does also report having recent sunburn to the skin of her legs after laying out 2 days ago. Patient denies having similar symptoms previously. She denies any injuries or trauma. She has not used any treatment for symptoms. No other aggravating or alleviating factors. Denies any associated fever, chills or sweats. No erythematous streaks up the leg. No injuries or trauma to the skin. No prior history of DVT or PE. No recent travel or immobility. No cough or hemoptysis. No chest pain or shortness of breath. No estrogen or birth control use. No history of cancer.    Past Medical History  Diagnosis Date  . Varicose veins     Past Surgical History  Procedure Laterality Date  . Cesarean section      X 2    Family History  Problem Relation Age of Onset  . Cancer Mother     squamous cell  . Cancer Paternal Uncle   . Cancer Maternal Grandfather   . Diabetes Maternal Grandfather   . Cancer Paternal Grandmother   . Cancer Paternal Grandfather     History  Substance Use Topics  . Smoking status: Current Some Day Smoker  . Smokeless tobacco: Never Used     Comment: pt states that she is using e-cigs no tabacco since 02/2012  . Alcohol Use: No    OB History   Grav Para Term Preterm Abortions TAB SAB Ect Mult Living                 Review of Systems  Constitutional: Negative for fever, chills and diaphoresis.  Respiratory: Negative for cough and shortness of breath.   Cardiovascular: Positive for leg swelling. Negative for chest pain.  All other systems reviewed and are negative.    Allergies  Neosporin  Home Medications   Current Outpatient Rx  Name  Route  Sig  Dispense  Refill  . Linaclotide (LINZESS) 145 MCG CAPS   Oral   Take 145 mcg by mouth daily.         . Multiple Vitamin (MULTIVITAMIN WITH MINERALS) TABS   Oral   Take 1 tablet by mouth daily.         . QUEtiapine (SEROQUEL) 100 MG tablet   Oral   Take 350 mg by mouth at bedtime.         Marland Kitchen tiZANidine (ZANAFLEX) 4 MG capsule   Oral   Take 8 mg by mouth as needed.             BP 132/84  Pulse 102  Temp(Src) 98.4 F (36.9 C) (Oral)  Resp 20  Ht 5' 7.5" (1.715 m)  Wt 197 lb 8 oz (89.585 kg)  BMI 30.46 kg/m2  SpO2 100%  LMP 06/15/2012  Physical Exam  Nursing note and vitals reviewed. Constitutional: She is oriented to person, place, and time. She appears well-developed and well-nourished. No distress.  HENT:  Head: Normocephalic.  Cardiovascular: Normal rate and regular rhythm.   Pulmonary/Chest: Effort normal and breath sounds normal. No respiratory distress. She has no wheezes. She has no rales.  Abdominal: Soft.  Musculoskeletal: Normal range of motion. She exhibits edema and tenderness.  There is a diffuse erythema to the bilateral lower extremities greatest on the anterior aspect. Consistent with history of increased sun exposure. There is pitting edema bilaterally. Swelling however is much greater in the right lower extremity extending to the proximal shin and lower leg. There is also additional swelling around the ankle and foot. Dorsal pedal pulses intact and equal bilaterally. Normal movements in the joints. Normal sensation in the feet and toes. There is mild tenderness to palpation around the right  ankle and calf.  Negative holman's sign.  Neurological: She is alert and oriented to person, place, and time.  Skin: Skin is warm and dry. No rash noted.  Psychiatric: She has a normal mood and affect. Her behavior is normal.    ED Course  Procedures       1. Lower extremity edema       MDM  10:50PM patient seen and evaluated. Patient appears well in no acute distress. Reports very mild discomfort still legs. She was recently in the sun and has mild sunburn with erythema to bilateral legs. There is pitting edema to both lower extremities however swelling is greatest to the right lower extremity was slightly increased tenderness. Patient has no significant risk factors for DVT. No recent immobility. No prior history of DVT or PE. No estrogen or birth control. No history of cancer.  I did discuss with patient the option to receive Lovenox shot is additional preventative measures but she does not wish to have this. She is willing to followup tomorrow for ultrasound Doppler study.        Angus Seller, PA-C 06/29/12 2349

## 2012-06-29 NOTE — ED Notes (Signed)
Pt states that her right shoe was tight all day today, when she was taking a shower she noticed her leg red, swollen and tight, the right one more than the left one

## 2012-06-30 ENCOUNTER — Ambulatory Visit (HOSPITAL_COMMUNITY)
Admission: RE | Admit: 2012-06-30 | Discharge: 2012-06-30 | Disposition: A | Discharge status: Home / Self Care | Payer: BC Managed Care – PPO | Source: Ambulatory Visit | Attending: Emergency Medicine | Admitting: Emergency Medicine

## 2012-06-30 DIAGNOSIS — W899XXA Exposure to unspecified man-made visible and ultraviolet light, initial encounter: Secondary | ICD-10-CM | POA: Insufficient documentation

## 2012-06-30 DIAGNOSIS — M7989 Other specified soft tissue disorders: Secondary | ICD-10-CM | POA: Insufficient documentation

## 2012-06-30 DIAGNOSIS — M79609 Pain in unspecified limb: Secondary | ICD-10-CM

## 2012-06-30 DIAGNOSIS — L559 Sunburn, unspecified: Secondary | ICD-10-CM | POA: Insufficient documentation

## 2012-06-30 NOTE — Progress Notes (Signed)
*  PRELIMINARY RESULTS* Vascular Ultrasound Right lower extremity venous duplex has been completed.  Preliminary findings: Right = negative for DVT.  Farrel Demark, RDMS, RVT  06/30/2012, 2:56 PM

## 2012-07-01 NOTE — ED Provider Notes (Signed)
Medical screening examination/treatment/procedure(s) were performed by non-physician practitioner and as supervising physician I was immediately available for consultation/collaboration.   Micharl Helmes B. Jep Dyas, MD 07/01/12 0003 

## 2012-07-27 ENCOUNTER — Other Ambulatory Visit: Payer: Self-pay | Admitting: Dermatology

## 2012-09-21 ENCOUNTER — Other Ambulatory Visit: Payer: Self-pay | Admitting: Gastroenterology

## 2012-09-21 DIAGNOSIS — R935 Abnormal findings on diagnostic imaging of other abdominal regions, including retroperitoneum: Secondary | ICD-10-CM

## 2012-09-26 ENCOUNTER — Other Ambulatory Visit: Payer: BC Managed Care – PPO

## 2013-02-07 ENCOUNTER — Other Ambulatory Visit: Payer: Self-pay | Admitting: *Deleted

## 2013-02-07 DIAGNOSIS — M79609 Pain in unspecified limb: Secondary | ICD-10-CM

## 2013-02-07 DIAGNOSIS — I83893 Varicose veins of bilateral lower extremities with other complications: Secondary | ICD-10-CM

## 2013-02-13 ENCOUNTER — Encounter: Payer: Self-pay | Admitting: Vascular Surgery

## 2013-02-14 ENCOUNTER — Ambulatory Visit: Payer: BC Managed Care – PPO | Admitting: Vascular Surgery

## 2013-02-14 ENCOUNTER — Encounter (HOSPITAL_COMMUNITY): Payer: BC Managed Care – PPO

## 2013-04-24 ENCOUNTER — Ambulatory Visit (HOSPITAL_COMMUNITY)
Admission: RE | Admit: 2013-04-24 | Discharge: 2013-04-24 | Disposition: A | Discharge status: Home / Self Care | Payer: BC Managed Care – PPO | Source: Ambulatory Visit | Attending: Vascular Surgery | Admitting: Vascular Surgery

## 2013-04-24 ENCOUNTER — Other Ambulatory Visit: Payer: Self-pay | Admitting: Vascular Surgery

## 2013-04-24 ENCOUNTER — Encounter: Payer: Self-pay | Admitting: Vascular Surgery

## 2013-04-24 DIAGNOSIS — I83893 Varicose veins of bilateral lower extremities with other complications: Secondary | ICD-10-CM | POA: Insufficient documentation

## 2013-04-24 DIAGNOSIS — M79609 Pain in unspecified limb: Secondary | ICD-10-CM

## 2013-04-29 ENCOUNTER — Encounter (HOSPITAL_BASED_OUTPATIENT_CLINIC_OR_DEPARTMENT_OTHER): Payer: Self-pay | Admitting: Emergency Medicine

## 2013-04-29 ENCOUNTER — Emergency Department (HOSPITAL_BASED_OUTPATIENT_CLINIC_OR_DEPARTMENT_OTHER)
Admission: EM | Admit: 2013-04-29 | Discharge: 2013-04-29 | Disposition: A | Discharge status: Home / Self Care | Payer: BC Managed Care – PPO | Attending: Emergency Medicine | Admitting: Emergency Medicine

## 2013-04-29 DIAGNOSIS — Z792 Long term (current) use of antibiotics: Secondary | ICD-10-CM | POA: Insufficient documentation

## 2013-04-29 DIAGNOSIS — Z23 Encounter for immunization: Secondary | ICD-10-CM | POA: Insufficient documentation

## 2013-04-29 DIAGNOSIS — Z8679 Personal history of other diseases of the circulatory system: Secondary | ICD-10-CM | POA: Insufficient documentation

## 2013-04-29 DIAGNOSIS — X789XXA Intentional self-harm by unspecified sharp object, initial encounter: Secondary | ICD-10-CM | POA: Insufficient documentation

## 2013-04-29 DIAGNOSIS — Z79899 Other long term (current) drug therapy: Secondary | ICD-10-CM | POA: Insufficient documentation

## 2013-04-29 DIAGNOSIS — S51809A Unspecified open wound of unspecified forearm, initial encounter: Secondary | ICD-10-CM | POA: Insufficient documentation

## 2013-04-29 DIAGNOSIS — Z7289 Other problems related to lifestyle: Secondary | ICD-10-CM

## 2013-04-29 DIAGNOSIS — F17200 Nicotine dependence, unspecified, uncomplicated: Secondary | ICD-10-CM | POA: Insufficient documentation

## 2013-04-29 DIAGNOSIS — IMO0002 Reserved for concepts with insufficient information to code with codable children: Secondary | ICD-10-CM

## 2013-04-29 MED ORDER — BACITRACIN ZINC 500 UNIT/GM EX OINT: 1.0000 "application " | TOPICAL_OINTMENT | Freq: Two times a day (BID) | CUTANEOUS | Status: DC

## 2013-04-29 MED ORDER — DOXYCYCLINE HYCLATE 100 MG PO CAPS: 100.0000 mg | ORAL_CAPSULE | Freq: Two times a day (BID) | ORAL | Status: DC

## 2013-04-29 MED ORDER — TETANUS-DIPHTH-ACELL PERTUSSIS 5-2.5-18.5 LF-MCG/0.5 IM SUSP
0.5000 mL | Freq: Once | INTRAMUSCULAR | Status: AC
  Administered 2013-04-29: 0.5 mL via INTRAMUSCULAR
  Filled 2013-04-29: qty 0.5

## 2013-04-29 NOTE — ED Provider Notes (Signed)
CSN: [id number]     Arrival date & time 04/29/13  1415 History   First MD Initiated Contact with Patient 04/29/13 1437     Chief Complaint  Patient presents with  . Extremity Laceration     (Consider location/radiation/quality/duration/timing/severity/associated sxs/prior Treatment) The history is provided by the patient.   Patient here complaining of self-inflicted lacerations to bilateral forearms last night. She denies that this was a suicide attempt. States that she does have a history of self-mutilation when she becomes stressed. Denies any prior suicide attempt. The etiology of her stresses due to relationship issue. Denies any weakness in her hands. No trouble with paresthesias in her fingers. She has had normal function in both hands. Lacerations occurred approximately 15 hours prior to arrival. She used peroxide on her wounds after the event. She is here now because she is concerned about a deeper laceration to the right forearm. Past Medical History  Diagnosis Date  . Varicose veins    Past Surgical History  Procedure Laterality Date  . Cesarean section      X 2   Family History  Problem Relation Age of Onset  . Cancer Mother     squamous cell  . Cancer Paternal Uncle   . Cancer Maternal Grandfather   . Diabetes Maternal Grandfather   . Cancer Paternal Grandmother   . Cancer Paternal Grandfather    History  Substance Use Topics  . Smoking status: Current Some Day Smoker  . Smokeless tobacco: Never Used     Comment: pt states that she is using e-cigs no tabacco since 02/2012  . Alcohol Use: No   OB History   Grav Para Term Preterm Abortions TAB SAB Ect Mult Living                 Review of Systems  All other systems reviewed and are negative.      Allergies  Neosporin  Home Medications   Current Outpatient Rx  Name  Route  Sig  Dispense  Refill  . azithromycin (ZITHROMAX) 250 MG tablet   Oral   Take 250 mg by mouth daily.         . Linaclotide  (LINZESS) 145 MCG CAPS   Oral   Take 145 mcg by mouth daily.         . Multiple Vitamin (MULTIVITAMIN WITH MINERALS) TABS   Oral   Take 1 tablet by mouth daily.         . QUEtiapine (SEROQUEL) 100 MG tablet   Oral   Take 100 mg by mouth at bedtime.          Marland Kitchen tiZANidine (ZANAFLEX) 4 MG capsule   Oral   Take 8 mg by mouth as needed.            BP 131/83  Pulse 88  Temp(Src) 98.2 F (36.8 C) (Oral)  Resp 20  Ht 5\' 7"  (1.702 m)  Wt 190 lb (86.183 kg)  BMI 29.75 kg/m2  SpO2 99%  LMP 03/20/2013 Physical Exam  Nursing note and vitals reviewed. Constitutional: She is oriented to person, place, and time. She appears well-developed and well-nourished.  Non-toxic appearance. No distress.  HENT:  Head: Normocephalic and atraumatic.  Eyes: Conjunctivae, EOM and lids are normal. Pupils are equal, round, and reactive to light.  Neck: Normal range of motion. Neck supple. No tracheal deviation present. No mass present.  Cardiovascular: Normal rate, regular rhythm and normal heart sounds.  Exam reveals no gallop.  No murmur heard. Pulmonary/Chest: Effort normal and breath sounds normal. No stridor. No respiratory distress. She has no decreased breath sounds. She has no wheezes. She has no rhonchi. She has no rales.  Abdominal: Soft. Normal appearance and bowel sounds are normal. She exhibits no distension. There is no tenderness. There is no rebound and no CVA tenderness.  Musculoskeletal: Normal range of motion. She exhibits no edema and no tenderness.       Arms: Neurological: She is alert and oriented to person, place, and time. She has normal strength. No cranial nerve deficit or sensory deficit. GCS eye subscore is 4. GCS verbal subscore is 5. GCS motor subscore is 6.  Skin: Skin is warm and dry. No abrasion and no rash noted.  Psychiatric: She has a normal mood and affect. Her speech is normal and behavior is normal.    ED Course  Procedures (including critical care  time) Labs Review Labs Reviewed - No data to display Imaging Review No results found.   EKG Interpretation None      MDM   Final diagnoses:  None    Patients tetanus has been updated the wounds will be dressed by nursing. Patient's concerned about developing MRSA and I will give her a prescription for doxycycline to only be used if she develops an infection. I do not believe that this was a suicide attempt. She was instructed to see her counselor.    Leota Jacobsen, MD 04/29/13 1501
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
## 2013-04-29 NOTE — Discharge Instructions (Signed)
Apply bacitracin to the wound twice a day. Do not use the doxycycline until you have signs of infection. That would be green or yellowish drainage, fever, increased redness around the wound Wound Care Wound care helps prevent pain and infection.  You may need a tetanus shot if:  You cannot remember when you had your last tetanus shot.  You have never had a tetanus shot.  The injury broke your skin. If you need a tetanus shot and you choose not to have one, you may get tetanus. Sickness from tetanus can be serious. HOME CARE   Only take medicine as told by your doctor.  Clean the wound daily with mild soap and water.  Change any bandages (dressings) as told by your doctor.  Put medicated cream and a bandage on the wound as told by your doctor.  Change the bandage if it gets wet, dirty, or starts to smell.  Take showers. Do not take baths, swim, or do anything that puts your wound under water.  Rest and raise (elevate) the wound until the pain and puffiness (swelling) are better.  Keep all doctor visits as told. GET HELP RIGHT AWAY IF:   Yellowish-white fluid (pus) comes from the wound.  Medicine does not lessen your pain.  There is a red streak going away from the wound.  You have a fever. MAKE SURE YOU:   Understand these instructions.  Will watch your condition.  Will get help right away if you are not doing well or get worse. Document Released: 10/29/2007 Document Revised: 04/13/2011 Document Reviewed: 05/25/2010 [facility] Patient Information 2014 [place], [place].

## 2013-05-01 ENCOUNTER — Encounter: Payer: Self-pay | Admitting: Vascular Surgery

## 2013-05-02 ENCOUNTER — Encounter: Payer: Self-pay | Admitting: Vascular Surgery

## 2013-05-02 ENCOUNTER — Ambulatory Visit (INDEPENDENT_AMBULATORY_CARE_PROVIDER_SITE_OTHER): Payer: BC Managed Care – PPO | Admitting: Vascular Surgery

## 2013-05-02 VITALS — BP 121/86 | HR 76 | Resp 18 | Ht 67.5 in | Wt 193.4 lb

## 2013-05-02 DIAGNOSIS — M79609 Pain in unspecified limb: Secondary | ICD-10-CM

## 2013-05-02 DIAGNOSIS — I83893 Varicose veins of bilateral lower extremities with other complications: Secondary | ICD-10-CM | POA: Insufficient documentation

## 2013-05-02 NOTE — Progress Notes (Signed)
Here today for continued discussion regarding her left leg discomfort. She is a well known to me from prior staged bilateral laser ablation of great saphenous vein several years ago. I saw her in April she was describing a left leg discomfort. She continues to have this and is here today for further discussion. She also had a recent venous duplex which we are reviewing with her as well. She reports an achy sensation that is mostly in her pretibial area on the left this can extend into her thigh as well. It is worse with prolonged standing.  Past Medical History  Diagnosis Date  . Varicose veins     History  Substance Use Topics  . Smoking status: Current Every Day Smoker -- 0.50 packs/day  . Smokeless tobacco: Never Used     Comment: pt states that she is using e-cigs no tabacco since 02/2012  . Alcohol Use: No    Family History  Problem Relation Age of Onset  . Cancer Mother     squamous cell  . Cancer Paternal Uncle   . Cancer Maternal Grandfather   . Diabetes Maternal Grandfather   . Cancer Paternal Grandmother   . Cancer Paternal Grandfather     Allergies  Allergen Reactions  . Bacitracin     Itching, hives  . Neosporin [Neomycin-Polymyxin-Gramicidin] Rash    Current outpatient prescriptions:azithromycin (ZITHROMAX) 250 MG tablet, Take 250 mg by mouth daily., Disp: , Rfl: ;  Melatonin 10 MG TABS, Take 20 mg by mouth at bedtime., Disp: , Rfl: ;  Ondansetron HCl (ZOFRAN PO), Take by mouth as needed., Disp: , Rfl: ;  QUEtiapine (SEROQUEL) 100 MG tablet, Take 100 mg by mouth at bedtime. , Disp: , Rfl: ;  bacitracin ointment, Apply 1 application topically 2 (two) times daily., Disp: 120 g, Rfl: 0 doxycycline (VIBRAMYCIN) 100 MG capsule, Take 1 capsule (100 mg total) by mouth 2 (two) times daily., Disp: 28 capsule, Rfl: 0;  Linaclotide (LINZESS) 145 MCG CAPS, Take 145 mcg by mouth daily., Disp: , Rfl: ;  Multiple Vitamin (MULTIVITAMIN WITH MINERALS) TABS, Take 1 tablet by mouth  daily., Disp: , Rfl: ;  tiZANidine (ZANAFLEX) 4 MG capsule, Take 8 mg by mouth as needed.  , Disp: , Rfl:   BP 121/86  Pulse 76  Resp 18  Ht 5' 7.5" (1.715 m)  Wt 193 lb 6.4 oz (87.726 kg)  BMI 29.83 kg/m2  LMP 03/20/2013  Body mass index is 29.83 kg/(m^2).       Physical exam well-developed well-nourished female in no acute distress She does have some scattered telangiectasia over the pretibial area on the left but no evidence of varicosities. She has no swelling on her right or left leg.  Venous duplex reveals reflux throughout her left deep system in her common femoral femoral vein and popliteal vein. He does have the anterior chest or branch off of her saphenofemoral junction and does have reflux in this as well.  I imaged her left leg with SonoSite ultrasound. This does show closure of her great saphenous vein from the distal insertion site below her knee up to the saphenofemoral junction. She does have a short segment of anterior sensory branch was some collateral branches off of this.  I discussed this at length with the patient. I feel that that her symptoms are related to deep venous reflux and that the small segment of anterior sensory reflux is causing her minimal symptoms. I would not recommend ablation of this and so that  this would give very little improvement in her venous hypertension. I again discussed the critical importance of elevation graduated compression garments. She understands this discussion will see [place] again on an as-needed basis

## 2013-06-13 ENCOUNTER — Telehealth: Payer: Self-pay | Admitting: Neurology

## 2013-06-13 ENCOUNTER — Ambulatory Visit: Payer: BC Managed Care – PPO | Admitting: Neurology

## 2013-06-13 ENCOUNTER — Encounter: Payer: Self-pay | Admitting: Neurology

## 2013-06-13 NOTE — Telephone Encounter (Signed)
Pt no showed today's NP appt w/ Dr. Delice Lesch. No show letter mailed. Referring office notified / Sherri S.

## 2013-08-01 ENCOUNTER — Encounter (HOSPITAL_BASED_OUTPATIENT_CLINIC_OR_DEPARTMENT_OTHER): Payer: Self-pay | Admitting: Emergency Medicine

## 2013-08-01 ENCOUNTER — Emergency Department (HOSPITAL_BASED_OUTPATIENT_CLINIC_OR_DEPARTMENT_OTHER)
Admission: EM | Admit: 2013-08-01 | Discharge: 2013-08-02 | Disposition: A | Discharge status: Other Acute Inpatient Hospital | Payer: BC Managed Care – PPO | Attending: Emergency Medicine | Admitting: Emergency Medicine

## 2013-08-01 DIAGNOSIS — Z79899 Other long term (current) drug therapy: Secondary | ICD-10-CM | POA: Insufficient documentation

## 2013-08-01 DIAGNOSIS — S51009A Unspecified open wound of unspecified elbow, initial encounter: Secondary | ICD-10-CM | POA: Insufficient documentation

## 2013-08-01 DIAGNOSIS — F0391 Unspecified dementia with behavioral disturbance: Secondary | ICD-10-CM | POA: Insufficient documentation

## 2013-08-01 DIAGNOSIS — Z8679 Personal history of other diseases of the circulatory system: Secondary | ICD-10-CM | POA: Insufficient documentation

## 2013-08-01 DIAGNOSIS — W268XXA Contact with other sharp object(s), not elsewhere classified, initial encounter: Secondary | ICD-10-CM | POA: Insufficient documentation

## 2013-08-01 DIAGNOSIS — F03918 Unspecified dementia, unspecified severity, with other behavioral disturbance: Secondary | ICD-10-CM | POA: Insufficient documentation

## 2013-08-01 DIAGNOSIS — Z792 Long term (current) use of antibiotics: Secondary | ICD-10-CM | POA: Insufficient documentation

## 2013-08-01 DIAGNOSIS — Y9289 Other specified places as the place of occurrence of the external cause: Secondary | ICD-10-CM | POA: Insufficient documentation

## 2013-08-01 DIAGNOSIS — F3113 Bipolar disorder, current episode manic without psychotic features, severe: Secondary | ICD-10-CM

## 2013-08-01 DIAGNOSIS — F3013 Manic episode, severe, without psychotic symptoms: Secondary | ICD-10-CM | POA: Insufficient documentation

## 2013-08-01 DIAGNOSIS — IMO0002 Reserved for concepts with insufficient information to code with codable children: Secondary | ICD-10-CM | POA: Insufficient documentation

## 2013-08-01 DIAGNOSIS — Y9389 Activity, other specified: Secondary | ICD-10-CM | POA: Insufficient documentation

## 2013-08-01 DIAGNOSIS — Z3202 Encounter for pregnancy test, result negative: Secondary | ICD-10-CM | POA: Insufficient documentation

## 2013-08-01 DIAGNOSIS — F17200 Nicotine dependence, unspecified, uncomplicated: Secondary | ICD-10-CM | POA: Insufficient documentation

## 2013-08-01 DIAGNOSIS — T148XXA Other injury of unspecified body region, initial encounter: Secondary | ICD-10-CM

## 2013-08-01 HISTORY — DX: Bipolar disorder, unspecified: F31.9

## 2013-08-01 NOTE — ED Provider Notes (Addendum)
CSN: [id number]     Arrival date & time 08/01/13  2218 History   None    This chart was scribed for April Alfonso Patten, MD by Forrestine Him, ED Scribe. This patient was seen in room MH12/MH12 and the patient's care was started 11:24 PM.   Chief Complaint  Patient presents with  . Medical Clearance   Patient is a 33 y.o. female presenting with skin laceration. The history is provided by the patient. No language interpreter was used.  Laceration Location:  Head/neck and shoulder/arm Head/neck laceration location:  L neck Shoulder/arm laceration location:  L forearm Depth:  Through dermis Quality: straight   Bleeding: controlled   Pain details:    Quality:  Aching   Severity:  Mild   Timing:  Constant   Progression:  Unchanged Foreign body present:  No foreign bodies Relieved by:  Nothing Worsened by:  Nothing tried Ineffective treatments:  None tried Also one to the neck and several superficial to the left forearm.    HPI Comments: Andrea Gallegos is a 33 y.o. Female with a PMHx of Bipolar disorder who presents to the Emergency Department seeking medical clearance today. Pt admits to cutting her L forearm about 45 minutes prior to arrival. States she typically cuts herself after drinking alcohol which stems from her getting emotional and upset. States during this incident she was thinking about her children. She admits to drinking about 4 beers prior to incident. However, she states she does not consume alcohol every day.  She is not currently followed by a Psychologist. She denies any psychiatric hospitalizations. She is currently weaning off of Seroquel prescribed for Bipolar disorder.   Past Medical History  Diagnosis Date  . Varicose veins   . Bipolar disorder    Past Surgical History  Procedure Laterality Date  . Cesarean section      X 2  . Endovenous ablation saphenous vein w/ laser Left 06-12-2009    left greater saphenous vein  . Endovenous ablation saphenous vein w/  laser Right 07-10-2009    right greater saphenous vein   Family History  Problem Relation Age of Onset  . Cancer Mother     squamous cell  . Cancer Paternal Uncle   . Cancer Maternal Grandfather   . Diabetes Maternal Grandfather   . Cancer Paternal Grandmother   . Cancer Paternal Grandfather    History  Substance Use Topics  . Smoking status: Current Every Day Smoker -- 0.50 packs/day  . Smokeless tobacco: Never Used  . Alcohol Use: Yes   OB History   Grav Para Term Preterm Abortions TAB SAB Ect Mult Living                 Review of Systems  Constitutional: Negative for fever and chills.  Musculoskeletal: Negative for arthralgias.  Skin: Positive for wound.  Psychiatric/Behavioral: Positive for self-injury and agitation. Negative for confusion.  All other systems reviewed and are negative.     Allergies  Bacitracin and Neosporin  Home Medications   Prior to Admission medications   Medication Sig Start Date End Date Taking? Authorizing Provider  azithromycin (ZITHROMAX) 250 MG tablet Take 250 mg by mouth daily. 04/28/13   Historical Provider, MD  bacitracin ointment Apply 1 application topically 2 (two) times daily. 04/29/13   Leota Jacobsen, MD  doxycycline (VIBRAMYCIN) 100 MG capsule Take 1 capsule (100 mg total) by mouth 2 (two) times daily. 04/29/13   Leota Jacobsen, MD  Linaclotide Rolan Lipa)  145 MCG CAPS Take 145 mcg by mouth daily.    Historical Provider, MD  Melatonin 10 MG TABS Take 20 mg by mouth at bedtime.    Historical Provider, MD  Multiple Vitamin (MULTIVITAMIN WITH MINERALS) TABS Take 1 tablet by mouth daily.    Historical Provider, MD  Ondansetron HCl (ZOFRAN PO) Take by mouth as needed.    Historical Provider, MD  QUEtiapine (SEROQUEL) 100 MG tablet Take 50 mg by mouth at bedtime.     Historical Provider, MD  tiZANidine (ZANAFLEX) 4 MG capsule Take 8 mg by mouth as needed.      Historical Provider, MD   Triage Vitals: BP 138/94  Pulse 116  Temp(Src)  99.1 F (37.3 C) (Oral)  Resp 20  Ht 5\' 7"  (1.702 m)  Wt 198 lb (89.812 kg)  BMI 31.00 kg/m2  SpO2 96%  LMP 07/25/2013   Physical Exam  Nursing note and vitals reviewed. Constitutional: She is oriented to person, place, and time. She appears well-developed and well-nourished. No distress.  HENT:  Head: Normocephalic and atraumatic.  Mouth/Throat: Oropharynx is clear and moist. No oropharyngeal exudate.  Eyes: EOM are normal. Pupils are equal, round, and reactive to light.  Neck: Normal range of motion.  Cardiovascular: Normal rate, regular rhythm and normal heart sounds.   Pulmonary/Chest: Effort normal and breath sounds normal.  Abdominal: Soft. Bowel sounds are normal. She exhibits no distension. There is no tenderness. There is no rebound and no guarding.  Musculoskeletal: Normal range of motion.       Arms:      Left hand: She exhibits normal two-point discrimination, normal capillary refill, no deformity and no swelling. Normal sensation noted. Normal strength noted.  Neurological: She is alert and oriented to person, place, and time. She has normal reflexes.  Skin: Skin is warm and dry.  Abrasion over voicebox 1 cm wide 2.5 cm long gapping superficial laceration just distal to sub cubital  Multiple surrounding superficial linear lacerations  Psychiatric: Her speech is rapid and/or pressured. She is agitated and aggressive. She expresses impulsivity and inappropriate judgment.    ED Course  Procedures (including critical care time)  DIAGNOSTIC STUDIES: Oxygen Saturation is 96% on RA, adequate by my interpretation.    COORDINATION OF CARE: 10:48 PM-Discussed treatment plan with pt at bedside and pt agreed to plan.     Labs Review Labs Reviewed - No data to display  Imaging Review No results found.   EKG Interpretation None      MDM   Final diagnoses:  None   Patient is currently self titrating off seroquel and is bipolar.  She attempted to cut neck, the cut  is superficial but is near the IJ.    LACERATION REPAIR Performed by: Carlisle Beers Authorized by: Carlisle Beers Consent: Verbal consent obtained. Risks and benefits: risks, benefits and alternatives were discussed Consent given by: patient Patient identity confirmed: provided demographic data Prepped and Draped in normal sterile fashion Wound explored  Laceration Location: volar left forearm  Laceration Length: 3.5 cm  No Foreign Bodies seen or palpated  Anesthesia: local infiltration  topival anesthetic: lidocaine with epinephrine   Irrigation method: syringe Amount of cleaning: standard  Skin closure: staples  Number of sutures: 7  Technique: staples  Patient tolerance: Patient tolerated the procedure well with no immediate complications.  LACERATION REPAIR Performed by: Carlisle Beers Authorized by: Carlisle Beers Consent: Verbal consent obtained. Risks and benefits: risks, benefits and alternatives were discussed Consent given by: patient  Patient identity confirmed: provided demographic data Prepped and Draped in normal sterile fashion Wound explored  Laceration Location: volar foream  Laceration Length: 3.5 cm  Amount of cleaning: standard  Skin closure: dermabond  Technique: dermabond  Patient tolerance: Patient tolerated the procedure well with no immediate complications.    She needs inpatient stabilization.  She became combative and aggressive and later reported after wound was closed she was going to [facility] because she plans on seeing a therapist.  However, originally she denied this to [place] with scribe present.  EDP was very clear on initial assessment she would need to be cleared by psychiatry    I personally performed the services described in this documentation, which was scribed in my presence. The recorded information has been reviewed and is accurate.    Carlisle Beers, MD 08/02/13 3403  April K  Palumbo-Rasch, MD 08/02/13 [phone]

## 2013-08-01 NOTE — ED Notes (Signed)
Pt tearful-states "i'm a cutter"-states she cut her left forearm approx 45 min PTA-states she cuts self when she drinks-denies cutting as a suicide attempt

## 2013-08-02 ENCOUNTER — Encounter (HOSPITAL_BASED_OUTPATIENT_CLINIC_OR_DEPARTMENT_OTHER): Payer: Self-pay | Admitting: Emergency Medicine

## 2013-08-02 ENCOUNTER — Encounter (HOSPITAL_COMMUNITY): Payer: Self-pay | Admitting: *Deleted

## 2013-08-02 ENCOUNTER — Inpatient Hospital Stay (HOSPITAL_COMMUNITY)
Admission: EM | Admit: 2013-08-02 | Discharge: 2013-08-05 | DRG: 885 | Disposition: A | Discharge status: Home / Self Care | Payer: BC Managed Care – PPO | Source: Intra-hospital | Attending: Psychiatry | Admitting: Psychiatry

## 2013-08-02 DIAGNOSIS — Z5987 Material hardship due to limited financial resources, not elsewhere classified: Secondary | ICD-10-CM

## 2013-08-02 DIAGNOSIS — IMO0001 Reserved for inherently not codable concepts without codable children: Secondary | ICD-10-CM | POA: Diagnosis present

## 2013-08-02 DIAGNOSIS — F4310 Post-traumatic stress disorder, unspecified: Secondary | ICD-10-CM | POA: Diagnosis present

## 2013-08-02 DIAGNOSIS — F1010 Alcohol abuse, uncomplicated: Secondary | ICD-10-CM

## 2013-08-02 DIAGNOSIS — F332 Major depressive disorder, recurrent severe without psychotic features: Principal | ICD-10-CM | POA: Diagnosis present

## 2013-08-02 DIAGNOSIS — Z5989 Other problems related to housing and economic circumstances: Secondary | ICD-10-CM

## 2013-08-02 DIAGNOSIS — Z598 Other problems related to housing and economic circumstances: Secondary | ICD-10-CM

## 2013-08-02 DIAGNOSIS — F329 Major depressive disorder, single episode, unspecified: Secondary | ICD-10-CM | POA: Diagnosis present

## 2013-08-02 DIAGNOSIS — F411 Generalized anxiety disorder: Secondary | ICD-10-CM | POA: Diagnosis present

## 2013-08-02 DIAGNOSIS — F17200 Nicotine dependence, unspecified, uncomplicated: Secondary | ICD-10-CM | POA: Diagnosis present

## 2013-08-02 DIAGNOSIS — Z833 Family history of diabetes mellitus: Secondary | ICD-10-CM

## 2013-08-02 DIAGNOSIS — F319 Bipolar disorder, unspecified: Secondary | ICD-10-CM | POA: Diagnosis present

## 2013-08-02 DIAGNOSIS — G4700 Insomnia, unspecified: Secondary | ICD-10-CM | POA: Diagnosis present

## 2013-08-02 DIAGNOSIS — F321 Major depressive disorder, single episode, moderate: Secondary | ICD-10-CM

## 2013-08-02 DIAGNOSIS — F1910 Other psychoactive substance abuse, uncomplicated: Secondary | ICD-10-CM

## 2013-08-02 DIAGNOSIS — F1994 Other psychoactive substance use, unspecified with psychoactive substance-induced mood disorder: Secondary | ICD-10-CM

## 2013-08-02 LAB — COMPREHENSIVE METABOLIC PANEL
ALBUMIN: 4.3 g/dL (ref 3.5–5.2)
ALT: 21 U/L (ref 0–35)
AST: 17 U/L (ref 0–37)
Alkaline Phosphatase: 94 U/L (ref 39–117)
BUN: 11 mg/dL (ref 6–23)
CO2: 25 mEq/L (ref 19–32)
CREATININE: 0.7 mg/dL (ref 0.50–1.10)
Calcium: 9.1 mg/dL (ref 8.4–10.5)
Chloride: 104 mEq/L (ref 96–112)
GFR calc non Af Amer: >90 mL/min (ref >90)
GLUCOSE: 81 mg/dL (ref 70–99)
Potassium: 4 mEq/L (ref 3.7–5.3)
Sodium: 143 mEq/L (ref 137–147)
TOTAL PROTEIN: 8 g/dL (ref 6.0–8.3)
Total Bilirubin: 0.3 mg/dL (ref 0.3–1.2)

## 2013-08-02 LAB — CBC WITH DIFFERENTIAL/PLATELET
BASOS PCT: 0 % (ref 0–1)
Basophils Absolute: 0 10*3/uL (ref 0.0–0.1)
EOS ABS: 0.7 10*3/uL (ref 0.0–0.7)
EOS PCT: 9 % — AB (ref 0–5)
HEMATOCRIT: 40.5 % (ref 36.0–46.0)
HEMOGLOBIN: 14 g/dL (ref 12.0–15.0)
Lymphocytes Relative: 30 % (ref 12–46)
Lymphs Abs: 2.4 10*3/uL (ref 0.7–4.0)
MCH: 30.7 pg (ref 26.0–34.0)
MCHC: 34.6 g/dL (ref 30.0–36.0)
MCV: 88.8 fL (ref 78.0–100.0)
MONO ABS: 0.4 10*3/uL (ref 0.1–1.0)
MONOS PCT: 5 % (ref 3–12)
Neutro Abs: 4.6 10*3/uL (ref 1.7–7.7)
Neutrophils Relative %: 56 % (ref 43–77)
Platelets: 260 10*3/uL (ref 150–400)
RBC: 4.56 MIL/uL (ref 3.87–5.11)
RDW: 13.2 % (ref 11.5–15.5)
WBC: 8.1 10*3/uL (ref 4.0–10.5)

## 2013-08-02 LAB — ETHANOL: Alcohol, Ethyl (B): 84 mg/dL — ABNORMAL HIGH (ref 0–11)

## 2013-08-02 LAB — ACETAMINOPHEN LEVEL

## 2013-08-02 LAB — PREGNANCY, URINE: Preg Test, Ur: NEGATIVE

## 2013-08-02 LAB — RAPID URINE DRUG SCREEN, HOSP PERFORMED
Amphetamines: NOT DETECTED
BARBITURATES: NOT DETECTED
Benzodiazepines: NOT DETECTED
COCAINE: NOT DETECTED
OPIATES: NOT DETECTED
Tetrahydrocannabinol: NOT DETECTED

## 2013-08-02 LAB — SALICYLATE LEVEL

## 2013-08-02 MED ORDER — ACETAMINOPHEN 325 MG PO TABS
650.0000 mg | ORAL_TABLET | ORAL | Status: DC | PRN
  Administered 2013-08-02: 650 mg via ORAL
  Filled 2013-08-02: qty 2

## 2013-08-02 MED ORDER — ONDANSETRON HCL 8 MG PO TABS: 4.0000 mg | ORAL_TABLET | Freq: Three times a day (TID) | ORAL | Status: DC | PRN

## 2013-08-02 MED ORDER — ALUM & MAG HYDROXIDE-SIMETH 200-200-20 MG/5ML PO SUSP: 30.0000 mL | ORAL | Status: DC | PRN

## 2013-08-02 MED ORDER — LAMOTRIGINE 25 MG PO TABS
25.0000 mg | ORAL_TABLET | Freq: Every day | ORAL | Status: DC
  Administered 2013-08-02 – 2013-08-05 (×4): 25 mg via ORAL
  Filled 2013-08-02 (×5): qty 1
  Filled 2013-08-02: qty 14
  Filled 2013-08-02 (×3): qty 1

## 2013-08-02 MED ORDER — MAGNESIUM HYDROXIDE 400 MG/5ML PO SUSP
30.0000 mL | Freq: Every day | ORAL | Status: DC | PRN
  Administered 2013-08-03: 30 mL via ORAL

## 2013-08-02 MED ORDER — NICOTINE 21 MG/24HR TD PT24: 21.0000 mg | MEDICATED_PATCH | Freq: Every day | TRANSDERMAL | Status: DC

## 2013-08-02 MED ORDER — NICOTINE 21 MG/24HR TD PT24
MEDICATED_PATCH | TRANSDERMAL | Status: AC
  Filled 2013-08-02: qty 1

## 2013-08-02 MED ORDER — ACETAMINOPHEN 325 MG PO TABS: 650.0000 mg | ORAL_TABLET | Freq: Four times a day (QID) | ORAL | Status: DC | PRN

## 2013-08-02 MED ORDER — LIDOCAINE-EPINEPHRINE-TETRACAINE (LET) SOLUTION
3.0000 mL | Freq: Once | NASAL | Status: AC
  Administered 2013-08-02: 3 mL via TOPICAL
  Filled 2013-08-02: qty 3

## 2013-08-02 MED ORDER — HYDROXYZINE HCL 25 MG PO TABS
25.0000 mg | ORAL_TABLET | Freq: Four times a day (QID) | ORAL | Status: DC | PRN
  Administered 2013-08-02 – 2013-08-04 (×4): 25 mg via ORAL
  Filled 2013-08-02 (×4): qty 1

## 2013-08-02 MED ORDER — QUETIAPINE FUMARATE 25 MG PO TABS
25.0000 mg | ORAL_TABLET | Freq: Every day | ORAL | Status: AC
  Administered 2013-08-02 – 2013-08-03 (×2): 25 mg via ORAL
  Filled 2013-08-02 (×3): qty 1

## 2013-08-02 MED ORDER — CITALOPRAM HYDROBROMIDE 10 MG PO TABS
10.0000 mg | ORAL_TABLET | Freq: Every day | ORAL | Status: DC
  Administered 2013-08-02 – 2013-08-03 (×2): 10 mg via ORAL
  Filled 2013-08-02 (×5): qty 1

## 2013-08-02 MED ORDER — IBUPROFEN 400 MG PO TABS: 600.0000 mg | ORAL_TABLET | Freq: Three times a day (TID) | ORAL | Status: DC | PRN

## 2013-08-02 NOTE — ED Notes (Signed)
[facility] office states they have not received paperwork. Paperwork has been faxed twice by Ezzard Flax, rn. hppd on stand by for transport.

## 2013-08-02 NOTE — [facility] Group Notes (Signed)
[place] [facility] LCSW Group Therapy  Emotional Regulation 1:15 - 2: 30 PM        08/02/2013     Type of Therapy:  Group Therapy  Participation Level:  Appropriate  Participation Quality:  Appropriate  Affect:  Appropriate  Cognitive:  Attentive Appropriate  Insight:  Developing/Improving Engaged  Engagement in Therapy:  Developing/Improving Engaged  Modes of Intervention:  Discussion Exploration Problem-Solving Supportive  Summary of Progress/Problems:  Group topic was emotional regulations.  Patient participated in the discussion and was able to identify fear as tthe emotion that needs to regulated.  Patient was able to identify approprite coping skills.  Andrea Gallegos 08/02/2013

## 2013-08-02 NOTE — Progress Notes (Signed)
Adult Psychoeducational Group Note  Date:  08/02/2013 Time:  10:06 PM  Group Topic/Focus:  Wrap-Up Group:   The focus of this group is to help patients review their daily goal of treatment and discuss progress on daily workbooks.  Participation Level:  Active  Participation Quality:  Appropriate  Affect:  Appropriate  Cognitive:  Appropriate  Insight: Appropriate  Engagement in Group:  Engaged  Modes of Intervention:  Discussion  Additional Comments: The patient expressed that she learned about happiness and contentment .The patient said that she learn the difference for her life.  Nash Shearer 08/02/2013, 10:06 PM

## 2013-08-02 NOTE — ED Notes (Signed)
telepsyche screening in progress

## 2013-08-02 NOTE — [facility] Suicide Risk Assessment (Signed)
[place] INPATIENT:  Family/Significant Other Suicide Prevention Education  Suicide Prevention Education:  Patient Refusal for Family/Significant Other Suicide Prevention Education: The patient Andrea Gallegos has refused to provide written consent for family/significant other to be provided Family/Significant Other Suicide Prevention Education during admission and/or prior to discharge.  Physician notified.  Concha Pyo 08/02/2013, 12:48 PM

## 2013-08-02 NOTE — ED Provider Notes (Signed)
Patient accepted to [facility] She has no new complaints BP 116/83  Pulse 80  Temp(Src) 98 F (36.7 C) (Oral)  Resp 20  Ht 5\' 7"  (1.702 m)  Wt 198 lb (89.812 kg)  BMI 31.00 kg/m2  SpO2 100%  LMP 07/25/2013   Sharyon Cable, MD 08/02/13 [phone]
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
## 2013-08-02 NOTE — BH Assessment (Signed)
Called to provide RN with update. Spoke with Diane, RN and informed her Pt can be admitted at 0800 to 500-2, and provided the number for report [phone]. Diane expressed concerns about not having a notary at [facility], and asked if one could be sent out. This Probation officer will attempt to get this information and call Diane back.   Lear Ng, [facility] Triage Specialist 08/02/2013 4:35 AM

## 2013-08-02 NOTE — BH Assessment (Signed)
Spoke with Dr. Randal Buba, EDP prior to assessment. Dr. Randal Buba reports Pt meets inpatient criteria due to self-mutilation. Laceration on arm requiring staples, and other marks on arm and near jugular. Dr. Randal Buba reports Pt is not being cooperative and that Dr. Randal Buba is working on [employer].   Checked with Herbert Spires, [facility] to determine if an appropriate bed is available at [facility]. Tori reports Pt can be accepted to bed 500-2 at 0800.  Per Patriciaann Clan, PA Pt meets inpatient criteria. He asked for assessment to be attempted to provide further information. Patriciaann Clan, PA would like CIWA scores, and if Pt is medically cleared she can be accepted to [facility] later in the morning.   Spoke with Mikki Santee, RN to inform of pending assessment and Pt being able to come to [facility] later in the morning.   TA to commence shortly.  Lear Ng, [facility] Triage Specialist 08/02/2013 3:38 AM

## 2013-08-02 NOTE — ED Notes (Signed)
Pt resting quietly in nad. Large purse noted on chair in room beside pt, purse secured in locked cabinet at nurse's station. Pt states her bf brought purse to her earlier. Pt is alert and cooperative, given warm blankets and updated on plan to call [facility] at [place] for possible bed assignment.
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
## 2013-08-02 NOTE — ED Notes (Signed)
Pt d/c [place] custody of [place] [employer] to be transported to [facility]. All paperwork and pt belongings given to [facility].
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
## 2013-08-03 DIAGNOSIS — F321 Major depressive disorder, single episode, moderate: Secondary | ICD-10-CM

## 2013-08-03 LAB — URINALYSIS, ROUTINE W REFLEX MICROSCOPIC
BILIRUBIN URINE: NEGATIVE
Glucose, UA: NEGATIVE mg/dL
KETONES UR: NEGATIVE mg/dL
Nitrite: NEGATIVE
Protein, ur: NEGATIVE mg/dL
SPECIFIC GRAVITY, URINE: 1.019 (ref 1.005–1.030)
UROBILINOGEN UA: 0.2 mg/dL (ref 0.0–1.0)
pH: 6.5 (ref 5.0–8.0)

## 2013-08-03 LAB — URINE MICROSCOPIC-ADD ON

## 2013-08-03 MED ORDER — CITALOPRAM HYDROBROMIDE 10 MG PO TABS
10.0000 mg | ORAL_TABLET | Freq: Every day | ORAL | Status: DC
  Administered 2013-08-04: 10 mg via ORAL
  Filled 2013-08-03: qty 1
  Filled 2013-08-03: qty 14
  Filled 2013-08-03 (×2): qty 1
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
## 2013-08-03 NOTE — [facility] Group Notes (Signed)
[place] LCSW Group Therapy  [facility] 1:15 - 2:30 PM  08/03/2013 2:49 PM       Type of Therapy:  Group Therapy  Participation Level:  Left group to meet with MD   Concha Pyo  08/03/2013 2:49 PM

## 2013-08-03 NOTE — Progress Notes (Signed)
Patient ID: Andrea Gallegos, female   DOB: September 19, 1980, 33 y.o.   MRN: [id number]   D: After the introduction the writer asked the pt about her day. Pt stated she hasn't had any sleep for 3 days. Informed the writer that her Dr was supposed to put an order in for a sleep med. Writer informed pt of seroquel order. Pt was doubtful that 25mg  of seroquel would help her get sleep. Writer asked pt to explain the circumstances surrounding her adm. Pt stated that she has a history of cutting, but this was the deepest she's done. Stated that she has "stressors and cutting helps to relieve it". However, pt wouldn't didn't explain any stressors. When asked if she would be willing to discuss with her Dr. Abbott Pao informed the writer that she already has "people in place" after discharge including Dr, boyfriend and best friend.  A:  Support and encouragement was offered. 15 min checks continued for safety.  R: Pt remains safe.

## 2013-08-03 NOTE — Progress Notes (Signed)
Recreation Therapy Notes  Animal-Assisted Activity/Therapy (AAA/T) Program Checklist/Progress Notes Patient Eligibility Criteria Checklist & Daily Group note for Rec Tx Intervention  Date: 07.02.2015 Time: 3:15pm Location: [address]   AAA/T Program Assumption of Risk Form signed by Patient/ or Parent Legal Guardian yes  Patient is free of allergies or sever asthma no  Patient reports no fear of animals yes  Patient reports no history of cruelty to animals yes   Patient understands his/her participation is voluntary yes  Patient washes hands before animal contact yes  Patient washes hands after animal contact yes  Behavioral Response: Observation, Appropriate   Education: Hand Washing, Appropriate Animal Interaction   Education Outcome: Acknowledges understanding   Clinical Observations/Feedback: Patient reported at beginning of session she has an allergy to animal, despite allergy patient chose to remain in session. Patient observed peer interaction with therapy dog, but had no direct contact. Patient showed no signs or symptoms during session to indicate she was having any allergic reaction to therapy dog fur or dander.   Laureen Ochs Govanni Plemons, LRT/CTRS  Beatrice Sehgal L 08/03/2013 4:16 PM
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
## 2013-08-03 NOTE — [facility] Group Notes (Signed)
Adult Psychoeducational Group Note  Date:  08/03/2013 Time:  0900am  Group Topic/Focus:  Orientation:   The focus of this group is to educate the patient on the purpose and policies of crisis stabilization and provide a format to answer questions about their admission.  The group details unit policies and expectations of patients while admitted.  Participation Level:  Active  Participation Quality:  Appropriate, Attentive, Sharing and Supportive  Affect:  Appropriate  Cognitive:  Alert and Appropriate  Insight: Appropriate and Good  Engagement in Group:  Engaged and Supportive  Modes of Intervention:  Discussion, Education, Orientation and Support  Additional Comments:  Pt participated in group appropriately, shared and was supportive to others as they shared.  Wynn Banker 08/03/2013, 7:00 PM

## 2013-08-04 LAB — URINE CULTURE
CULTURE: NO GROWTH
Colony Count: NO GROWTH
SPECIAL REQUESTS: NORMAL

## 2013-08-04 MED ORDER — TRAZODONE HCL 50 MG PO TABS
50.0000 mg | ORAL_TABLET | Freq: Every evening | ORAL | Status: DC | PRN
  Administered 2013-08-04: 50 mg via ORAL
  Filled 2013-08-04: qty 1
  Filled 2013-08-04: qty 14

## 2013-08-04 NOTE — Progress Notes (Signed)
D: Pt denies SI/HI/AV. Pt is pleasant and cooperative. Pt is ready to go home.  A: Pt was offered support and encouragement. Pt was given scheduled medications. Pt was encourage to attend groups. Q 15 minute checks were done for safety.  R:Pt attends groups and interacts well with peers and staff. Pt  taking medication. Pt has no complaints.Pt receptive to treatment and safety maintained on unit.

## 2013-08-04 NOTE — Progress Notes (Signed)
[facility] Adult Case Management Discharge Plan :  Will you be returning to the same living situation after discharge: Yes,  Patient will return to her home. At discharge, do you have transportation home?:Yes,  Patient to arrange transportation home. Do you have the ability to pay for your medications:Yes,  Patient is able to obtain medications.  Release of information consent forms completed and in the chart;  Patient's signature needed at discharge.  Patient to Follow up at: Follow-up Information   Follow up with Dr. Doyne Keel - [facility] On 09/07/2013. (Thursday September 07, 2013)    Contact information:   [place], [place]  [phone]      Follow up with Nunzio Cobbs - [facility] On 08/10/2013. (Thursday, August 10, 2013 at 6:00 PM)    Contact information:   [place], [place]   [id number]  [phone]      Patient denies SI/HI: Patient no longer endorsing SI/HI or other thoughts of self harm.   Safety Planning and Suicide Prevention discussed:  Marland KitchenMarland KitchenReviewed with all patients during discharge planning group   Bria Portales, [name] 08/04/2013, 1:06 PM

## 2013-08-04 NOTE — Progress Notes (Signed)
[facility] Group Notes:  (Nursing/MHT/Case Management/Adjunct)  Date:  08/04/2013  Time:  11:37 PM  Type of Therapy:  Group Therapy  Participation Level:  Active  Participation Quality:  Appropriate  Affect:  Appropriate  Cognitive:  Appropriate  Insight:  Appropriate  Engagement in Group:  Engaged  Modes of Intervention:  Socialization and Support  Summary of Progress/Problems: Pt. Was appropriate and engaged in group.  Pt. Was able to identify early warning signs for relapse prevention.  Lanell Persons 08/04/2013, 11:37 PM

## 2013-08-04 NOTE — Progress Notes (Signed)
D [name] is OOB UAL on the 500 hall today...she tolerates this well. She is compliant with  Her meds. She attends her scheduled groups. SHe asks this nurse at least 4 times " what do I do about my staples in my arm?". \  A She is medicated per MD order.She completes her morning assessment and on it she writes she deniees SI within the past 24 hrs, she rates her depression and hopelessness "1/1' and she writes her DC plan is to " abstain from  ETOH, find fun activities and start writing again">   R Safety is in plac ena dpoc cont.

## 2013-08-04 NOTE — Tx Team (Addendum)
Inte but will ask patient for consent for collateral contactrdisciplinary Treatment Plan Update   Date Reviewed:  08/04/2013  Time Reviewed:  9:44 AM  Progress in Treatment:   Attending groups: Yes Participating in groups: Yes Taking medication as prescribed: Yes  Tolerating medication: Yes Family/Significant other contact made: Yes, collateral contact withPatient understands diagnosis: Yes  Discussing patient identified problems/goals with staff: Yes Medical problems stabilized or resolved: Yes Denies suicidal/homicidal ideation: Yes Patient has not harmed self or others: Yes  For review of initial/current patient goals, please see plan of care.  Estimated Length of Stay:  1 day - discharge Saturday  Reasons for Continued Hospitalization:  Anxiety Depression Medication stabilization   New Problems/Goals identified:    Discharge Plan or Barriers:   Home with outpatient follow up with [facility] and Nunz[facility] Cobbs  Additional Comments:  Attendees:  Patient:  08/04/2013 9:44 AM   Signature:  Gabriel Earing, MD 08/04/2013 9:44 AM  Signature:  08/04/2013 9:44 AM  Signature:  Eduard Roux, RN 08/04/2013 9:44 AM  Signature:  Drake Leach, RN 08/04/2013 9:44 AM  Signature:  Talbert Cage, RN 08/04/2013 9:44 AM  Signature:  Joette Catching, LCSW 08/04/2013 9:44 AM   08/04/2013 9:44 AM   08/04/2013 9:44 AM   08/04/2013 9:44 AM   08/04/2013  9:44 AM   08/04/2013  9:44 AM   08/04/2013  9:44 AM    Scribe for Treatment Team:   Joette Catching,  08/04/2013 9:44 AM

## 2013-08-04 NOTE — Progress Notes (Signed)
Report received from N. Careers information officer. Writer entered patient's room and observed her lying in bed awake. She reports having trouble sleeping and was informed of the medications she had received from N. Careers information officer. Writer encouraged her to try and relax and let her medication take effect. She voiced no other complaints. Safety maintained on unit with 15 min checks.R

## 2013-08-04 NOTE — Progress Notes (Signed)
D: Pt is flat in affect and anxious in mood. Pt reports that she actually feels much better despite the short time span from now and the initiation of her meds. She is denying any suicidal ideation. No reports of HI/AVH verbalized by pt. Pt is hoping to go home on Friday. She denying any side effects with her medications. Pt attended group this evening. Pt observed interacting appropriately within the milieu.  A: Writer administered scheduled and prn medications to pt. Continued support and availability as needed was extended to this pt. Staff continue to monitor pt with q39min checks.  R: No adverse drug reactions noted. Pt receptive to treatment. Pt remains safe at this time.

## 2013-08-04 NOTE — [facility] Suicide Risk Assessment (Signed)
[facility] INPATIENT:  Family/Significant Other Suicide Prevention Education  Suicide Prevention Education:  Education Completed; Stefani Dama, Father, [phone]; has been identified by the patient as the family member/significant other with whom the patient will be residing, and identified as the person(s) who will aid the patient in the event of a mental health crisis (suicidal ideations/suicide attempt).  With written consent from the patient, the family member/significant other has been provided the following suicide prevention education, prior to the and/or following the discharge of the patient.  The suicide prevention education provided includes the following:  Suicide risk factors  Suicide prevention and interventions  National Suicide Hotline telephone number  [facility] assessment telephone number  [facility] Emergency Assistance [place] and/or Residential Mobile Crisis Unit telephone number  Request made of family/significant other to:  Remove weapons (e.g., guns, rifles, knives), all items previously/currently identified as safety concern.  Father advised patient does not have access to weapons.    Remove drugs/medications (over-the-counter, prescriptions, illicit drugs), all items previously/currently identified as a safety concern.  The family member/significant other verbalizes understanding of the suicide prevention education information provided.  The family member/significant other agrees to remove the items of safety concern listed above.  Concha Pyo 08/04/2013, 3:13 PM

## 2013-08-04 NOTE — [facility] Group Notes (Signed)
[facility] LCSW Group Therapy  Feelings Around Relapse 1:15 -2:30        08/04/2013   Type of Therapy:  Group Therapy  Participation Level:  Appropriate  Participation Quality:  Appropriate  Affect:  Appropriate  Cognitive:  Attentive Appropriate  Insight:  Developing/Improving  Engagement in Therapy: Developing/Improving  Modes of Intervention:  Discussion Exploration Problem-Solving Supportive  Summary of Progress/Problems:  The topic for today was feelings around relapse.    Patient processed feelings toward relapse and was able to relate to peers.Patient advised she would be engaging in cutting behaviors if she relapsed.   Patient identified coping skills that can be used to prevent a relapse.   Concha Pyo 08/04/2013

## 2013-08-04 NOTE — Progress Notes (Addendum)
Patient ID: Andrea Gallegos, female   DOB: Jun 21, 1980, 33 y.o.   MRN: [id number] [facility] MD Progress Note  08/04/2013 11:47 AM Andrea Gallegos  MRN:  [id number] Subjective:  Patient states she feels better and is hoping for discharge soon. She complains of insomnia.  Objective:   I have dicussed case with treatment team and I have met with patient. She reports improvement and states she is not having any current suicidal ideations. Behavior on unit in good control, no disruptive behaviors . Staff notes indicate  That patient has presented with some anxiety, but has reported feeling better. She denies medication side effects. She is participating in milieu.  Today we discussed the negative impact that alcohol abuse has likely had on mental health issues- patient is insightful that alcohol makes her more impulsive and more prone to cut. She states she intends to maintain sobriety/ avoid alcohol after discharge. I have encouraged 12 step program participation. Patient also has insight regarding psychodynamic issues, such as her fear of being alone/not being loved, and how this causes fear that she might lose her current BF/relationship. She plans to continue working on this issue after discharge via individual psychotherapy.  Diagnosis:   Depression NOS, Alcohol Abuse , Consider Cluster B traits  Total Time spent with patient: 20 minutes   ADL's:  Improved   Sleep: describes poor sleep  Appetite:  Fair   Suicidal Ideation:  At this time denies suicidal ideations or any desire or thought to cut self  Homicidal Ideation:   denies  AEB (as evidenced by):  Psychiatric Specialty Exam: Physical Exam  Review of Systems  Respiratory: Negative for cough and shortness of breath.   Cardiovascular: Negative for chest pain.  Genitourinary: Negative for dysuria and urgency.  Psychiatric/Behavioral: Positive for depression and substance abuse. The patient is nervous/anxious.     Blood pressure 113/76,  pulse 88, temperature 98 F (36.7 C), temperature source Oral, resp. rate 18, height 5' 7.5" (1.715 m), weight 81.647 kg (180 lb), last menstrual period 07/25/2013.Body mass index is 27.76 kg/(m^2).  General Appearance: Improved grooming   Eye Contact::  Good  Speech:  Normal Rate  Volume:  Normal  Mood:  Improving , less depressed  Affect:  Appropriate, mildly anxious   Thought Process:  Linear  Orientation:  Full (Time, Place, and Person)  Thought Content:  denies hallucinations, no delusions expressed, ruminative about being admitted to a locked psychiatric unit   Suicidal Thoughts:  She  denies any current suicidal ideations or any thoughts of cutting self   Homicidal Thoughts:  No  Memory:  NA  Judgement:  Fair  Insight:  Fair  Psychomotor Activity:  Normal  Concentration:  Good  Recall:  Good  Fund of Knowledge:Good  Language: Good  Akathisia:  Negative  Handed:  Right  AIMS (if indicated):     Assets:  Communication Skills Desire for Improvement Social Support  Sleep:  Number of Hours: 6   Musculoskeletal: Strength & Muscle Tone: within normal limits Gait & Station: normal Patient leans: NA   Current Medications: Current Facility-Administered Medications  Medication Dose Route Frequency Provider Last Rate Last Dose  . acetaminophen (TYLENOL) tablet 650 mg  650 mg Oral Q6H PRN Benjamine Mola, FNP      . alum & mag hydroxide-simeth (MAALOX/MYLANTA) 200-200-20 MG/5ML suspension 30 mL  30 mL Oral Q4H PRN Benjamine Mola, FNP      . citalopram (CELEXA) tablet 10 mg  10 mg Oral  QHS Neita Garnet, MD      . hydrOXYzine (ATARAX/VISTARIL) tablet 25 mg  25 mg Oral Q6H PRN Benjamine Mola, FNP   25 mg at 08/03/13 2142  . lamoTRIgine (LAMICTAL) tablet 25 mg  25 mg Oral Daily Benjamine Mola, FNP   25 mg at 08/04/13 0805  . magnesium hydroxide (MILK OF MAGNESIA) suspension 30 mL  30 mL Oral Daily PRN Benjamine Mola, FNP   30 mL at 08/03/13 0945    Lab Results:  Results for  orders placed during the hospital encounter of 08/02/13 (from the past 48 hour(s))  URINALYSIS, ROUTINE W REFLEX MICROSCOPIC     Status: Abnormal   Collection Time    08/03/13  6:44 PM      Result Value Ref Range   Color, Urine YELLOW  YELLOW   APPearance CLOUDY (*) CLEAR   Specific Gravity, Urine 1.019  1.005 - 1.030   pH 6.5  5.0 - 8.0   Glucose, UA NEGATIVE  NEGATIVE mg/dL   Hgb urine dipstick TRACE (*) NEGATIVE   Bilirubin Urine NEGATIVE  NEGATIVE   Ketones, ur NEGATIVE  NEGATIVE mg/dL   Protein, ur NEGATIVE  NEGATIVE mg/dL   Urobilinogen, UA 0.2  0.0 - 1.0 mg/dL   Nitrite NEGATIVE  NEGATIVE   Leukocytes, UA MODERATE (*) NEGATIVE   Comment: Performed at [facility]     Status: Abnormal   Collection Time    08/03/13  6:44 PM      Result Value Ref Range   Squamous Epithelial / LPF RARE  RARE   WBC, UA 3-6  <3 WBC/hpf   RBC / HPF 3-6  <3 RBC/hpf   Bacteria, UA FEW (*) RARE   Comment: Performed at [facility]    Physical Findings: AIMS: Facial and Oral Movements Muscles of Facial Expression: None, normal Lips and Perioral Area: None, normal Jaw: None, normal Tongue: None, normal,Extremity Movements Upper (arms, wrists, hands, fingers): None, normal Lower (legs, knees, ankles, toes): None, normal, Trunk Movements Neck, shoulders, hips: None, normal, Overall Severity Severity of abnormal movements (highest score from questions above): None, normal Incapacitation due to abnormal movements: None, normal Patient's awareness of abnormal movements (rate only patient's report): No Awareness,    CIWA:    COWS:     Assessment; Improved mood and affect. Continues to present with some anxiety, and complains of insomnia. States she is tolerating medications well and feels they are helping. She is hoping for discharge soon. Denies any further episodes of cutting - denies any SI currently.  Treatment Plan Summary: Daily  contact with patient to assess and evaluate symptoms and progress in treatment Medication management As below  Plan:  Continue Lamictal 25 mgrs a day Continue Celexa 10 mgrs  QHS   Continue to provide milieu and support.  As discussed with staff and with patient , consider discharge soon as she continues to stabilize/improve. Patient has an established therapist ( Ms. Nunzio Cobbs)   UCx pending- of note, patient has no fever, no UTI symptoms.  Medical Decision Making Problem Points:  Established problem, stable/improving (1), Review of last therapy session (1) and Review of psycho-social stressors (1) Data Points:  Review or order clinical lab tests (1) Review of medication regiment & side effects (2)  I certify that inpatient services furnished can reasonably be expected to improve the patient's condition.   Emmersyn Kratzke 08/04/2013, 11:47 AM

## 2013-08-05 DIAGNOSIS — F4310 Post-traumatic stress disorder, unspecified: Secondary | ICD-10-CM

## 2013-08-05 DIAGNOSIS — F339 Major depressive disorder, recurrent, unspecified: Secondary | ICD-10-CM

## 2013-08-05 DIAGNOSIS — F39 Unspecified mood [affective] disorder: Secondary | ICD-10-CM

## 2013-08-05 MED ORDER — CITALOPRAM HYDROBROMIDE 10 MG PO TABS: 10.0000 mg | ORAL_TABLET | Freq: Every day | ORAL | Status: DC

## 2013-08-05 MED ORDER — TRAZODONE HCL 50 MG PO TABS: 50.0000 mg | ORAL_TABLET | Freq: Every evening | ORAL | Status: DC | PRN

## 2013-08-05 MED ORDER — GABAPENTIN 600 MG PO TABS: 600.0000 mg | ORAL_TABLET | Freq: Every evening | ORAL | Status: DC | PRN

## 2013-08-05 MED ORDER — LAMOTRIGINE 25 MG PO TABS: 25.0000 mg | ORAL_TABLET | Freq: Every day | ORAL | Status: DC

## 2013-08-05 NOTE — Progress Notes (Signed)
Psychoeducational Group Note  Date: 08/05/2013 Time:  1015  Group Topic/Focus:  Identifying Needs:   The focus of this group is to help patients identify their personal needs that have been historically problematic and identify healthy behaviors to address their needs.  Participation Level:  Active  Participation Quality:  Appropriate  Affect:  Appropriate  Cognitive:  Oriented  Insight:  Improving  Engagement in Group:  Engaged  Additional Comments:  Pt attended and was involved in the discussions. Added much to the group  Pattye Meda A  
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
## 2013-08-05 NOTE — [facility] Suicide Risk Assessment (Signed)
Suicide Risk Assessment  Discharge Assessment     Demographic Factors:  Caucasian  Total Time spent with patient: 45 minutes  Psychiatric Specialty Exam:     Blood pressure 124/83, pulse 93, temperature 97.9 F (36.6 C), temperature source Oral, resp. rate 18, height 5' 7.5" (1.715 m), weight 81.647 kg (180 lb), last menstrual period 07/25/2013.Body mass index is 27.76 kg/(m^2).  General Appearance: Fairly Groomed  Engineer, water::  Fair  Speech:  Clear and Coherent  Volume:  Normal  Mood:  Euthymic  Affect:  Appropriate  Thought Process:  Coherent and Goal Directed  Orientation:  Full (Time, Place, and Person)  Thought Content:  plans as she moves on  Suicidal Thoughts:  No  Homicidal Thoughts:  No  Memory:  Immediate;   Fair Recent;   Fair Remote;   Fair  Judgement:  Fair  Insight:  Present  Psychomotor Activity:  Normal  Concentration:  Fair  Recall:  [employer] of Knowledge:NA  Language: Fair  Akathisia:  No  Handed:    AIMS (if indicated):     Assets:  Desire for Improvement Housing Social Support  Sleep:  Number of Hours: 6.25    Musculoskeletal: Strength & Muscle Tone: within normal limits Gait & Station: normal Patient leans: N/A   Mental Status Per Nursing Assessment::   On Admission:  Self-harm behaviors  Current Mental Status by Physician: In full contact with reality. There are no active SI plans or intent. Her mood is "much better." Affect is appropriate.  She states she has learned a lot being here. She has worked on validating her feelings and finding more appropriate ways of dealing with them. She admits she cuts to cope (not to kill herself) but she feels she can try different coping strategies that do not involved cutting. Willing to pursue outpatient follow up   Loss Factors: NA  Historical Factors: NA  Risk Reduction Factors:   Responsible for children under 71 years of age, Sense of responsibility to family, Religious beliefs about  death, Living with another person, especially a relative and Positive social support  Continued Clinical Symptoms:  Depression:   Impulsivity  Cognitive Features That Contribute To Risk:  Closed-mindedness Polarized thinking Thought constriction (tunnel vision)    Suicide Risk:  Minimal: No identifiable suicidal ideation.  Patients presenting with no risk factors but with morbid ruminations; may be classified as minimal risk based on the severity of the depressive symptoms  Discharge Diagnoses:   AXIS I:  Major Depression recurrent, PTSD, Mood Disorder NOS, Alcohol Abuse AXIS II:  No diagnosis AXIS III:   Past Medical History  Diagnosis Date  . Varicose veins   . Bipolar disorder    AXIS IV:  other psychosocial or environmental problems AXIS V:  61-70 mild symptoms  Plan Of Care/Follow-up recommendations:  Activity:  as tolerated Diet:  regular Follow up outpatient basis/Consider DBT  Is patient on multiple antipsychotic therapies at discharge:  No   Has Patient had three or more failed trials of antipsychotic monotherapy by history:  No  Recommended Plan for Multiple Antipsychotic Therapies: NA    [place] Warriner A 08/05/2013, 12:14 PM

## 2013-08-05 NOTE — [facility] Group Notes (Signed)
[facility] Group Notes:  (Nursing/MHT/Case Management/Adjunct)  Date:  08/05/2013  Time:  11:35 AM  Type of Therapy:  Psychoeducational Skills  Participation Level:  Active  Participation Quality:  Appropriate  Affect:  Appropriate  Cognitive:  Appropriate  Insight:  Appropriate  Engagement in Group:  Engaged  Modes of Intervention:  Discussion  Summary of Progress/Problems: Pt did attend self inventory group, pt reported that she was negative SI/HI, no AH/VH noted. Pt rated her depression as a 1, and her helplessness/hopelessness as a 0.     Pt reported no issues or concerns, was just looking forward to discharge today.    Benancio Deeds Shanta 08/05/2013, 11:35 AM

## 2013-08-05 NOTE — Progress Notes (Signed)
MD completed pt's DC SRA. And DC order. Pt denied SI, HI, and  / or presence of audit, vis, tactile hallucinations . Pt completed morning assessment and results discussed with this Probation officer . On it, pt wrote she denied SI within the past 24 hrs, she rated her depression and hopelessness 1/1 and stated her dc plan is to " continue to focus on the positive, to find support groups counseling and don't drink. ALl belongings returned to pt and pt signs release per protocol. Pt is given prescriptions for meds from MD as well as med samples form pharmacy and stated she understands drug name as well as dosages and times. Pt then escorted to bldg entrance and dc'd home without conflict.

## 2013-08-09 NOTE — Progress Notes (Signed)
Patient Discharge Instructions:  After Visit Summary (AVS):   Faxed to:  08/09/13 Discharge Summary Note:   Faxed to:  08/09/13 Psychiatric Admission Assessment Note:   Faxed to:  08/09/13 Suicide Risk Assessment - Discharge Assessment:   Faxed to:  08/09/13 Faxed/Sent to the Next Level Care provider:  08/09/13 Next Level Care Provider Has Access to the EMR, 08/09/13 Faxed to [facility] and [place] @ [phone] Records Provided to [facility] via [facility]/Epic access.  Patsey Berthold, 08/09/2013, 2:22 PM

## 2013-09-07 ENCOUNTER — Encounter (HOSPITAL_COMMUNITY): Payer: Self-pay | Admitting: Psychiatry

## 2013-09-07 ENCOUNTER — Encounter (INDEPENDENT_AMBULATORY_CARE_PROVIDER_SITE_OTHER): Payer: Self-pay

## 2013-09-07 ENCOUNTER — Ambulatory Visit (INDEPENDENT_AMBULATORY_CARE_PROVIDER_SITE_OTHER): Payer: BC Managed Care – PPO | Admitting: Psychiatry

## 2013-09-07 VITALS — BP 110/77 | HR 89 | Ht 67.4 in | Wt 179.0 lb

## 2013-09-07 DIAGNOSIS — Z79899 Other long term (current) drug therapy: Secondary | ICD-10-CM

## 2013-09-07 DIAGNOSIS — F331 Major depressive disorder, recurrent, moderate: Secondary | ICD-10-CM

## 2013-09-07 MED ORDER — ARIPIPRAZOLE 5 MG PO TABS
5.0000 mg | ORAL_TABLET | Freq: Every day | ORAL | Status: DC
Start: 2013-09-07 — End: 2013-11-07

## 2013-09-07 MED ORDER — PAROXETINE HCL 20 MG PO TABS
20.0000 mg | ORAL_TABLET | Freq: Every day | ORAL | Status: DC
Start: 2013-09-07 — End: 2013-11-07
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
## 2013-09-25 ENCOUNTER — Telehealth (HOSPITAL_COMMUNITY): Payer: Self-pay | Admitting: *Deleted

## 2013-09-25 DIAGNOSIS — G4700 Insomnia, unspecified: Secondary | ICD-10-CM

## 2013-09-25 NOTE — Telephone Encounter (Signed)
Patient left VM:Can lv message-only able to use cell phone [phone]) at break & lunch:10a,12n, 3p. having a hard time sleeping.Added Melatonin and OTC Zyquil,still only 2 hrs.Wants to try something to help that does not cause weight gain. Phoned patient, advised that Dr. Doyne Keel will receive message electronically and will be in office tomorrow.

## 2013-09-26 MED ORDER — HYDROXYZINE PAMOATE 25 MG PO CAPS
ORAL_CAPSULE | ORAL | Status: DC
Start: 2013-09-26 — End: 2013-10-03

## 2013-09-26 NOTE — Addendum Note (Signed)
Addended byCharlcie Cradle on: 09/26/2013 12:08 PM   Modules accepted: Orders

## 2013-09-26 NOTE — Telephone Encounter (Signed)
Pt reports she is not sleeping well. She is getting about 3-5 hrs/night. Melatonin and Zyquil are not helping. She also tried Trazodone but it didn't help.  Asking for a sleep aid.  A/P:  1. Start trial of Vistaril 25-50mg  po qHS prn insomnia 2. D/c Melatonin

## 2013-10-02 ENCOUNTER — Telehealth (HOSPITAL_COMMUNITY): Payer: Self-pay | Admitting: *Deleted

## 2013-10-02 DIAGNOSIS — G4700 Insomnia, unspecified: Secondary | ICD-10-CM

## 2013-10-02 NOTE — Telephone Encounter (Signed)
Patient left NH:AFBXUX Vistaril 1-2 at bedtime and adding Trazodone 300 mg that she had. Still not sleeeping, even taking all this.  Contacted patient :Informed her that message will be put in MD "In Basket" and that MD will be in office tomorrow. Thanked this Probation officer for calling. She would like to speak with MD.

## 2013-10-03 MED ORDER — HYDROXYZINE PAMOATE 25 MG PO CAPS: ORAL_CAPSULE | ORAL | Status: DC

## 2013-10-03 NOTE — Addendum Note (Signed)
Addended by: Charlcie Cradle on: 10/03/2013 09:47 AM   Modules accepted: Orders

## 2013-10-03 NOTE — Telephone Encounter (Signed)
Pt reports sleep is horrible- 3 Vistaril (75mg ) and 2 Trazodone (100mg ) and she slept "alot" and felt "stoned all day".  A/P: -Increase Vistaril to 75-100mg  po qHS prn insomnia. Pt will try out dose and if effective will call for refill. -D/c Trazodone

## 2013-11-07 ENCOUNTER — Ambulatory Visit (INDEPENDENT_AMBULATORY_CARE_PROVIDER_SITE_OTHER): Payer: BC Managed Care – PPO | Admitting: Psychiatry

## 2013-11-07 ENCOUNTER — Encounter (HOSPITAL_COMMUNITY): Payer: Self-pay | Admitting: Psychiatry

## 2013-11-07 VITALS — BP 132/83 | HR 86 | Ht 67.0 in | Wt 187.6 lb

## 2013-11-07 DIAGNOSIS — F17200 Nicotine dependence, unspecified, uncomplicated: Secondary | ICD-10-CM

## 2013-11-07 DIAGNOSIS — F331 Major depressive disorder, recurrent, moderate: Secondary | ICD-10-CM

## 2013-11-07 DIAGNOSIS — F5105 Insomnia due to other mental disorder: Secondary | ICD-10-CM

## 2013-11-07 DIAGNOSIS — F419 Anxiety disorder, unspecified: Secondary | ICD-10-CM

## 2013-11-07 MED ORDER — ARIPIPRAZOLE 5 MG PO TABS: 5.0000 mg | ORAL_TABLET | Freq: Every day | ORAL | Status: DC

## 2013-11-07 MED ORDER — ESZOPICLONE 2 MG PO TABS: 2.0000 mg | ORAL_TABLET | Freq: Every evening | ORAL | Status: DC | PRN

## 2013-11-07 MED ORDER — PAROXETINE HCL 20 MG PO TABS: 20.0000 mg | ORAL_TABLET | Freq: Every day | ORAL | Status: DC

## 2013-11-07 NOTE — Progress Notes (Signed)
[place] [phone] Progress Note  Rito Ehrlich [id number] 33 y.o.  11/07/2013 4:31 PM  Chief Complaint: "doing better"  History of Present Illness: 3 weeks ago started to feel better. No longer feeling hopeless. No longer drinking alcohol and last several weeks ago. Depression is significantly improved but still present. A lot of her mood has to do with not seeing her boys.  Sleep is poor and she is getting about 5 hrs of broken sleep. Vistaril is not helping.  Appetite increased and she can tell she has gained weight. Energy is fair. Denies anhedonia and isolation.   Anxiety is well controlled and tolerable.   Denies manic and hypomanic episodes including decreased need for sleep, increased energy, mood lability and impulsivity.   Taking Abilify and Paxil as prescribed and endorsing SE of weight gain.   Suicidal Ideation: No Plan Formed: No Patient has means to carry out plan: No  Homicidal Ideation: No Plan Formed: No Patient has means to carry out plan: No  Review of Systems: Psychiatric: Agitation: No Hallucination: No Depressed Mood: Yes Insomnia: Yes Hypersomnia: No Altered Concentration: Yes always had problems with focus.  Feels Worthless: No Grandiose Ideas: No Belief In Special Powers: No New/Increased Substance Abuse: No Compulsions: No  Neurologic: Headache: No Seizure: No Paresthesias: No  Past Medical Family, Social History: Pt lives in [place] with her boyfriend. Pt is divorced and has 2 sons who live with their father. 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  reports that she has been smoking Cigarettes.  She has a 15 pack-year smoking history. She has never used smokeless tobacco. She reports that she drinks alcohol but has not for last several weeks. She reports that she does not use illicit drugs.  Family History  Problem Relation Age of Onset  . Cancer Mother     squamous cell  .  Depression Mother   . Cancer Paternal Uncle   . Cancer Maternal Grandfather   . Diabetes Maternal Grandfather   . Cancer Paternal Grandmother   . Cancer Paternal Grandfather   . Depression Maternal Aunt   . Depression Cousin   . Suicidality Neg Hx    Past Medical History  Diagnosis Date  . Varicose veins   . Bipolar disorder   . Depression     Outpatient Encounter Prescriptions as of 11/07/2013  Medication Sig  . ARIPiprazole (ABILIFY) 5 MG tablet Take 1 tablet (5 mg total) by mouth daily.  . hydrOXYzine (VISTARIL) 25 MG capsule Take 75-100mg  po qHS prn insomnia  . naproxen sodium (ALEVE) 220 MG tablet Take 220 mg by mouth every 8 (eight) hours as needed (For pain.).  Marland Kitchen ondansetron (ZOFRAN) 8 MG tablet Take 8 mg by mouth 2 (two) times daily as needed for nausea or vomiting.  Marland Kitchen OVER THE COUNTER MEDICATION Take 1 scoop by mouth every morning. Plexus Slim Powder  . OVER THE COUNTER MEDICATION Take 1 capsule by mouth at bedtime. Plexus Bio Cleanse  . OVER THE COUNTER MEDICATION Take 1 capsule by mouth at bedtime. Plexus ProBio 5  . PARoxetine (PAXIL) 20 MG tablet Take 1 tablet (20 mg total) by mouth daily.  Marland Kitchen gabapentin (NEURONTIN) 600 MG tablet Take 1 tablet (600 mg total) by mouth at bedtime as needed (For leg pain.).    Past Psychiatric History/Hospitalization(s): Anxiety: No Bipolar Disorder: Yes Depression: Yes Mania: Yes Psychosis: No Schizophrenia: No Personality Disorder: No Hospitalization for psychiatric illness: Yes  History of Electroconvulsive Shock Therapy: No Prior Suicide Attempts: No  Physical Exam: Constitutional:  BP 132/83  Pulse 86  Ht 5\' 7"  (1.702 m)  Wt 187 lb 9.6 oz (85.095 kg)  BMI 29.38 kg/m2  General Appearance: alert, oriented, no acute distress and well nourished  Musculoskeletal: Strength & Muscle Tone: within normal limits Gait & Station: normal Patient leans: N/A  Mental Status Examination/Evaluation: Objective: Attitude: Calm and  cooperative  Appearance: Casual, appears to be stated age  Eye Contact::  Good  Speech:  Clear and Coherent and Normal Rate  Volume:  Normal  Mood:  depressed  Affect:  Full Range- appears brighter at today's appt   Thought Process:  Goal Directed, Linear and Logical  Orientation:  Full (Time, Place, and Person)  Thought Content:  Negative  Suicidal Thoughts:  No  Homicidal Thoughts:  No  Judgement:  Fair  Insight:  Fair  Concentration: good  Memory: Immediate-intact Recent-intact Remote-intact  Recall: fair  Language: fair  Gait and Station: normal  [employer] of Knowledge: average  Psychomotor Activity:  Normal  Akathisia:  No  Handed:  Right  AIMS (if indicated):  Facial and Oral Movements  Muscles of Facial Expression: None, normal  Lips and Perioral Area: None, normal  Jaw: None, normal  Tongue: None, normal Extremity Movements: Upper (arms, wrists, hands, fingers): None, normal  Lower (legs, knees, ankles, toes): None, normal,  Trunk Movements:  Neck, shoulders, hips: None, normal,  Overall Severity : Severity of abnormal movements (highest score from questions above): None, normal  Incapacitation due to abnormal movements: None, normal  Patient's awareness of abnormal movements (rate only patient's report): No Awareness, Dental Status  Current problems with teeth and/or dentures?: No  Does patient usually wear dentures?: No       Medical Decision Making (Choose Three): Established Problem, Stable/Improving (1), Review of Psycho-Social Stressors (1), Review or order clinical lab tests (1) and Review of Medication Regimen & Side Effects (2)  Assessment: AXIS I  MDD recurrent, moderate with anxiety; r/o Bipolar disorder, Nicotine dep   AXIS II  Deferred   AXIS III  Past Medical History    Diagnosis  Date    .  Varicose veins     .  Bipolar disorder     .  Depression    AXIS IV  other psychosocial or environmental problems   AXIS V  51-60 moderate symptoms       Treatment Plan/Recommendations:  Plan of Care:  Medication management with supportive therapy. Risks/benefits and SE of the medication discussed. Pt verbalized understanding and verbal consent obtained for treatment.  Affirm with the patient that the medications are taken as ordered. Patient expressed understanding of how their medications were to be used.  -improvement of symptoms   Laboratory: EKG, no new labs at this time. Reviewed labs 08/02/2013 CMP WNL, CBC WNL, UDS negative  Psychotherapy: Therapy: brief supportive therapy provided. Discussed psychosocial stressors in detail.   -pt doesn't want to change meds b/c they are working. Advised diet and exercise.   Medications:  Paxil 20mg  po qHS for mood and anxiety  Abilify 5mg  po qHS for mood adjunct treatment  D/c Vistaril -start trial of Lunesta 2mg  po qHS prn insomnia  Routine PRN Medications: No   Consultations: encouraged to f/up with PCP as needed   Safety Concerns: Pt denies SI and is at an acute low risk for suicide.Patient told to call clinic if any problems occur. Patient advised to go to  ER if they should develop SI/HI, side effects, or if symptoms worsen. Has crisis numbers to call if needed. Pt verbalized understanding.   Other: F/up in 3 months or sooner if needed     Charlcie Cradle, MD 11/07/2013

## 2013-11-07 NOTE — Patient Instructions (Signed)
EKG: call [phone] to schedule appt

## 2013-11-08 ENCOUNTER — Telehealth (HOSPITAL_COMMUNITY): Payer: Self-pay

## 2013-11-09 ENCOUNTER — Telehealth (HOSPITAL_COMMUNITY): Payer: Self-pay

## 2013-11-09 NOTE — Telephone Encounter (Signed)
I have printed it and signed it. It is ready for pick up.

## 2013-11-10 ENCOUNTER — Telehealth (HOSPITAL_COMMUNITY): Payer: Self-pay

## 2013-11-14 ENCOUNTER — Telehealth (HOSPITAL_COMMUNITY): Payer: Self-pay | Admitting: *Deleted

## 2013-11-14 NOTE — Telephone Encounter (Signed)
lmtcb due to her phone call about her medicaton. number provided.

## 2013-11-23 ENCOUNTER — Telehealth (HOSPITAL_COMMUNITY): Payer: Self-pay | Admitting: *Deleted

## 2013-11-23 DIAGNOSIS — F5105 Insomnia due to other mental disorder: Secondary | ICD-10-CM

## 2013-11-23 MED ORDER — ESZOPICLONE 2 MG PO TABS
2.0000 mg | ORAL_TABLET | Freq: Every evening | ORAL | Status: DC | PRN
Start: 2013-11-23 — End: 2014-02-27

## 2013-11-23 NOTE — Telephone Encounter (Signed)
Patient left TD:[facility] RX for Lunesta called to Pharmacy. Work hours do not permit her to pick up RX from office.Was told by office staff that must be picked up. What to do? RX still in office from appt on 11/07/13 - pt unable to pick up. Called RX to [facility] [address]. Contacted patient to let her know RX called to pharmacy

## 2013-12-08 ENCOUNTER — Telehealth (HOSPITAL_COMMUNITY): Payer: Self-pay | Admitting: *Deleted

## 2013-12-08 NOTE — Telephone Encounter (Signed)
Called [employer] for Prior Authorization of Lunesta for 30 day supply.  Approval given by Elsworth Soho #[id number]. Approved for 36 months due 12/08/2016.

## 2013-12-11 ENCOUNTER — Telehealth (HOSPITAL_COMMUNITY): Payer: Self-pay | Admitting: *Deleted

## 2013-12-11 DIAGNOSIS — F331 Major depressive disorder, recurrent, moderate: Secondary | ICD-10-CM

## 2013-12-11 NOTE — Telephone Encounter (Signed)
Per fax from [place] requires 90 day RX for Paroxetine (last RX 10/6,#30 w/2ref) and Aripiprazole (last RX 10/6, #30, w 2/ref) Sent to MD for authorization

## 2013-12-12 MED ORDER — ARIPIPRAZOLE 5 MG PO TABS: 5.0000 mg | ORAL_TABLET | Freq: Every day | ORAL | Status: DC

## 2013-12-12 MED ORDER — PAROXETINE HCL 20 MG PO TABS: 20.0000 mg | ORAL_TABLET | Freq: Every day | ORAL | Status: DC

## 2013-12-12 NOTE — Telephone Encounter (Signed)
Contacted pharmacy.Per Andrea Gallegos, each RX filled once for 30 days from original RX on 11/07/13. Only one 30 day RX allowed per insurance. New RX for 90 days/No refill sent thru escript

## 2013-12-12 NOTE — Telephone Encounter (Signed)
Yes a 90 day refill is fine for both scripts

## 2013-12-12 NOTE — Addendum Note (Signed)
Addended by: Rolland Bimler on: 12/12/2013 10:05 AM   Modules accepted: Orders

## 2014-01-04 ENCOUNTER — Encounter (HOSPITAL_COMMUNITY): Payer: Self-pay | Admitting: Psychiatry

## 2014-01-04 ENCOUNTER — Telehealth (HOSPITAL_COMMUNITY): Payer: Self-pay | Admitting: *Deleted

## 2014-01-04 NOTE — Telephone Encounter (Signed)
Pt states she needs a letter stating her prognosis for her mediation. Pt is giving permission to disclose all her treatment and diagnosis.
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
## 2014-02-08 ENCOUNTER — Ambulatory Visit (HOSPITAL_COMMUNITY): Payer: Self-pay | Admitting: Psychiatry

## 2014-02-27 ENCOUNTER — Ambulatory Visit (INDEPENDENT_AMBULATORY_CARE_PROVIDER_SITE_OTHER): Payer: BLUE CROSS/BLUE SHIELD | Admitting: Psychiatry

## 2014-02-27 ENCOUNTER — Encounter (HOSPITAL_COMMUNITY): Payer: Self-pay | Admitting: Psychiatry

## 2014-02-27 VITALS — BP 132/81 | HR 83 | Ht 67.5 in | Wt 173.0 lb

## 2014-02-27 DIAGNOSIS — F17200 Nicotine dependence, unspecified, uncomplicated: Secondary | ICD-10-CM

## 2014-02-27 DIAGNOSIS — F331 Major depressive disorder, recurrent, moderate: Secondary | ICD-10-CM

## 2014-02-27 DIAGNOSIS — F419 Anxiety disorder, unspecified: Secondary | ICD-10-CM | POA: Diagnosis not present

## 2014-02-27 DIAGNOSIS — F5105 Insomnia due to other mental disorder: Secondary | ICD-10-CM

## 2014-02-27 MED ORDER — ARIPIPRAZOLE 5 MG PO TABS: 5.0000 mg | ORAL_TABLET | Freq: Every day | ORAL | Status: DC

## 2014-02-27 MED ORDER — PAROXETINE HCL 20 MG PO TABS: 20.0000 mg | ORAL_TABLET | Freq: Every day | ORAL | Status: DC

## 2014-02-27 MED ORDER — ESZOPICLONE 2 MG PO TABS: 2.0000 mg | ORAL_TABLET | Freq: Every evening | ORAL | Status: DC | PRN

## 2014-02-27 NOTE — Progress Notes (Signed)
[place] [phone] Progress Note  Rito Ehrlich [id number] 34 y.o.  02/27/2014 4:14 PM  Chief Complaint: "really, really, really good"  History of Present Illness: Pt was diagnosed with Fibromyalgia and is on Flexaril.  Pt has rejoined [facility] and goes every Sunday. She has a lot of social support and is praying daily.  Work remains a Merchandiser, retail stressors. Pt goes her boys every other weekend.   Depression is significantly better. She feels happy and at peace. She is growing personally. No longer feeling hopeless. Sleep is good with Lunesta and she is getting about 6-7 hrs of sleep. Wakes up refreshed.  Appetite is decreased and she is happy about weight loss (30 lbs in last few months). Energy is on the low side. Denies anhedonia, crying spells, worthlessness, low motivation and isolation.   Anxiety is overall improved. At times she has moments of anxiety but she is working thru it.   Denies manic and hypomanic episodes including decreased need for sleep, increased energy, mood lability and impulsivity.   Taking Abilify, Lunesta and Paxil as prescribed and endorsing SE of weight gain.   Suicidal Ideation: No Plan Formed: No Patient has means to carry out plan: No  Homicidal Ideation: No Plan Formed: No Patient has means to carry out plan: No  Review of Systems: Psychiatric: Agitation: No Hallucination: No Depressed Mood: No Insomnia: No Hypersomnia: No Altered Concentration: Yes always had problems with focus.  Feels Worthless: No Grandiose Ideas: No Belief In Special Powers: No New/Increased Substance Abuse: No Compulsions: No  Neurologic: Headache: No Seizure: No Paresthesias: No  Review of Systems  Constitutional: Positive for weight loss and malaise/fatigue. Negative for fever and chills.  HENT: Negative for congestion and sore throat.   Eyes: Negative for blurred vision, double vision and redness.  Respiratory: Negative for cough, sputum production and  shortness of breath.   Cardiovascular: Negative for chest pain, palpitations and leg swelling.  Gastrointestinal: Positive for nausea and constipation. Negative for heartburn, vomiting and abdominal pain.  Genitourinary: Positive for urgency and frequency. Negative for dysuria and flank pain.  Musculoskeletal: Positive for myalgias. Negative for back pain, joint pain and neck pain.  Skin: Negative for itching and rash.  Neurological: Negative for dizziness, seizures, loss of consciousness and headaches.  Psychiatric/Behavioral: Negative for depression, suicidal ideas, hallucinations and substance abuse. The patient is not nervous/anxious and does not have insomnia.      Past Medical Family, Social History: Pt lives in [place] with her boyfriend. Pt is divorced and has 2 sons who live with their father. 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  reports that she has been smoking Cigarettes.  She has a 15 pack-year smoking history. She has never used smokeless tobacco. She reports that she drinks alcohol but has not for last several months. She reports that she does not use illicit drugs.  Family History  Problem Relation Age of Onset  . Cancer Mother     squamous cell  . Depression Mother   . Cancer Paternal Uncle   . Cancer Maternal Grandfather   . Diabetes Maternal Grandfather   . Cancer Paternal Grandmother   . Cancer Paternal Grandfather   . Depression Maternal Aunt   . Depression Cousin   . Suicidality Neg Hx    Past Medical History  Diagnosis Date  . Varicose veins   . Bipolar disorder   . Depression   . Fibromyalgia  Outpatient Encounter Prescriptions as of 02/27/2014  Medication Sig  . ARIPiprazole (ABILIFY) 5 MG tablet Take 1 tablet (5 mg total) by mouth daily.  . cyclobenzaprine (FLEXERIL) 10 MG tablet Take 10 mg by mouth 3 (three) times daily as needed for muscle spasms.  . eszopiclone (LUNESTA) 2 MG TABS tablet Take 1  tablet (2 mg total) by mouth at bedtime as needed for sleep. Take immediately before bedtime  . naproxen sodium (ALEVE) 220 MG tablet Take 220 mg by mouth every 8 (eight) hours as needed (For pain.).  Marland Kitchen ondansetron (ZOFRAN) 8 MG tablet Take 8 mg by mouth 2 (two) times daily as needed for nausea or vomiting.  Marland Kitchen PARoxetine (PAXIL) 20 MG tablet Take 1 tablet (20 mg total) by mouth daily.  Marland Kitchen gabapentin (NEURONTIN) 600 MG tablet Take 1 tablet (600 mg total) by mouth at bedtime as needed (For leg pain.). (Patient not taking: Reported on 02/27/2014)  . OVER THE COUNTER MEDICATION Take 1 scoop by mouth every morning. Plexus Slim Powder  . OVER THE COUNTER MEDICATION Take 1 capsule by mouth at bedtime. Plexus Bio Cleanse  . OVER THE COUNTER MEDICATION Take 1 capsule by mouth at bedtime. Plexus ProBio 5    Past Psychiatric History/Hospitalization(s): Anxiety: No Bipolar Disorder: Yes Depression: Yes Mania: Yes Psychosis: No Schizophrenia: No Personality Disorder: No Hospitalization for psychiatric illness: Yes History of Electroconvulsive Shock Therapy: No Prior Suicide Attempts: No  Physical Exam: Constitutional:  BP 132/81 mmHg  Pulse 83  Ht 5' 7.5" (1.715 m)  Wt 173 lb (78.472 kg)  BMI 26.68 kg/m2  General Appearance: alert, oriented, no acute distress and well nourished  Musculoskeletal: Strength & Muscle Tone: within normal limits Gait & Station: normal Patient leans: N/A  Mental Status Examination/Evaluation: Objective: Attitude: Calm and cooperative  Appearance: Casual, appears to be stated age  Eye Contact::  Good  Speech:  Clear and Coherent and Normal Rate  Volume:  Normal  Mood: euthymic  Affect:  Full Range- appears brighter at today's appt   Thought Process:  Goal Directed, Linear and Logical  Orientation:  Full (Time, Place, and Person)  Thought Content:  Negative  Suicidal Thoughts:  No  Homicidal Thoughts:  No  Judgement:  Fair  Insight:  Fair  Concentration:  good  Memory: Immediate-intact Recent-intact Remote-intact  Recall: fair  Language: fair  Gait and Station: normal  [employer] of Knowledge: average  Psychomotor Activity:  Normal  Akathisia:  No  Handed:  Right  AIMS (if indicated):  Facial and Oral Movements  Muscles of Facial Expression: None, normal  Lips and Perioral Area: None, normal  Jaw: None, normal  Tongue: None, normal Extremity Movements: Upper (arms, wrists, hands, fingers): None, normal  Lower (legs, knees, ankles, toes): None, normal,  Trunk Movements:  Neck, shoulders, hips: None, normal,  Overall Severity : Severity of abnormal movements (highest score from questions above): None, normal  Incapacitation due to abnormal movements: None, normal  Patient's awareness of abnormal movements (rate only patient's report): No Awareness, Dental Status  Current problems with teeth and/or dentures?: No  Does patient usually wear dentures?: No       Medical Decision Making (Choose Three): Established Problem, Stable/Improving (1), Review of Psycho-Social Stressors (1), Review or order clinical lab tests (1) and Review of Medication Regimen & Side Effects (2)  Assessment: AXIS I  MDD recurrent, moderate with anxiety; r/o Bipolar disorder, Nicotine dep   AXIS II  Deferred   AXIS III  Past Medical  History    Diagnosis  Date    .  Varicose veins     .  Bipolar disorder     .  Depression    AXIS IV  other psychosocial or environmental problems   AXIS V  51-60 moderate symptoms      Treatment Plan/Recommendations:  Plan of Care:  Medication management with supportive therapy. Risks/benefits and SE of the medication discussed. Pt verbalized understanding and verbal consent obtained for treatment.  Affirm with the patient that the medications are taken as ordered. Patient expressed understanding of how their medications were to be used.  -improvement of symptoms   Laboratory: EKG, no new labs at this time   Psychotherapy: Therapy: brief supportive therapy provided. Discussed psychosocial stressors in detail.   -pt doesn't want to change meds b/c they are working. Advised diet and exercise.   Medications:  Paxil 20mg  po qHS for mood and anxiety  Abilify 5mg  po qHS for mood adjunct treatment  Lunesta 2mg  po qHS prn insomnia  Routine PRN Medications: No   Consultations: encouraged to f/up with PCP as needed   Safety Concerns: Pt denies SI and is at an acute low risk for suicide.Patient told to call clinic if any problems occur. Patient advised to go to ER if they should develop SI/HI, side effects, or if symptoms worsen. Has crisis numbers to call if needed. Pt verbalized understanding.   Other: F/up in 3 months or sooner if needed     Charlcie Cradle, MD 02/27/2014

## 2014-04-05 ENCOUNTER — Other Ambulatory Visit: Payer: Self-pay | Admitting: Gastroenterology

## 2014-04-05 DIAGNOSIS — R1011 Right upper quadrant pain: Secondary | ICD-10-CM

## 2014-04-09 ENCOUNTER — Ambulatory Visit (HOSPITAL_COMMUNITY)
Admission: RE | Admit: 2014-04-09 | Discharge: 2014-04-09 | Disposition: A | Discharge status: Home / Self Care | Payer: BLUE CROSS/BLUE SHIELD | Source: Ambulatory Visit | Attending: Gastroenterology | Admitting: Gastroenterology

## 2014-04-09 DIAGNOSIS — K769 Liver disease, unspecified: Secondary | ICD-10-CM | POA: Insufficient documentation

## 2014-04-09 DIAGNOSIS — R1011 Right upper quadrant pain: Secondary | ICD-10-CM | POA: Insufficient documentation

## 2014-04-09 DIAGNOSIS — R634 Abnormal weight loss: Secondary | ICD-10-CM | POA: Insufficient documentation

## 2014-04-09 DIAGNOSIS — R110 Nausea: Secondary | ICD-10-CM | POA: Diagnosis not present

## 2014-04-17 ENCOUNTER — Ambulatory Visit (HOSPITAL_COMMUNITY)
Admission: RE | Admit: 2014-04-17 | Discharge: 2014-04-17 | Disposition: A | Discharge status: Home / Self Care | Payer: BLUE CROSS/BLUE SHIELD | Source: Ambulatory Visit | Attending: Gastroenterology | Admitting: Gastroenterology

## 2014-04-17 DIAGNOSIS — R110 Nausea: Secondary | ICD-10-CM | POA: Diagnosis not present

## 2014-04-17 DIAGNOSIS — R634 Abnormal weight loss: Secondary | ICD-10-CM | POA: Insufficient documentation

## 2014-04-17 DIAGNOSIS — R1011 Right upper quadrant pain: Secondary | ICD-10-CM | POA: Insufficient documentation

## 2014-04-17 MED ORDER — SINCALIDE 5 MCG IJ SOLR
0.0200 ug/kg | Freq: Once | INTRAMUSCULAR | Status: AC
  Administered 2014-04-17: 1.57 ug via INTRAVENOUS

## 2014-04-17 MED ORDER — TECHNETIUM TC 99M MEBROFENIN IV KIT
5.0000 | PACK | Freq: Once | INTRAVENOUS | Status: AC | PRN
  Administered 2014-04-17: 5 via INTRAVENOUS

## 2014-04-17 MED ORDER — SINCALIDE 5 MCG IJ SOLR
INTRAMUSCULAR | Status: AC
  Administered 2014-04-17: 1.57 ug via INTRAVENOUS
  Filled 2014-04-17: qty 5

## 2014-05-09 ENCOUNTER — Other Ambulatory Visit: Payer: Self-pay | Admitting: Otolaryngology

## 2014-05-09 DIAGNOSIS — E041 Nontoxic single thyroid nodule: Secondary | ICD-10-CM

## 2014-05-10 ENCOUNTER — Ambulatory Visit
Admission: RE | Admit: 2014-05-10 | Discharge: 2014-05-10 | Disposition: A | Discharge status: Home / Self Care | Payer: BLUE CROSS/BLUE SHIELD | Source: Ambulatory Visit | Attending: Otolaryngology | Admitting: Otolaryngology

## 2014-05-10 ENCOUNTER — Other Ambulatory Visit: Payer: Self-pay

## 2014-05-10 DIAGNOSIS — E041 Nontoxic single thyroid nodule: Secondary | ICD-10-CM

## 2014-05-11 ENCOUNTER — Other Ambulatory Visit: Payer: Self-pay

## 2014-05-22 ENCOUNTER — Other Ambulatory Visit (HOSPITAL_COMMUNITY)
Admission: RE | Admit: 2014-05-22 | Discharge: 2014-05-22 | Disposition: A | Discharge status: Home / Self Care | Payer: BLUE CROSS/BLUE SHIELD | Source: Ambulatory Visit | Attending: Otolaryngology | Admitting: Otolaryngology

## 2014-05-22 ENCOUNTER — Other Ambulatory Visit: Payer: Self-pay | Admitting: Otolaryngology

## 2014-05-22 DIAGNOSIS — E041 Nontoxic single thyroid nodule: Secondary | ICD-10-CM | POA: Insufficient documentation

## 2014-05-29 ENCOUNTER — Ambulatory Visit (INDEPENDENT_AMBULATORY_CARE_PROVIDER_SITE_OTHER): Payer: Self-pay | Admitting: Psychiatry

## 2014-05-29 ENCOUNTER — Encounter (HOSPITAL_COMMUNITY): Payer: Self-pay | Admitting: Psychiatry

## 2014-05-29 VITALS — BP 142/86 | HR 76 | Ht 67.5 in | Wt 156.0 lb

## 2014-05-29 DIAGNOSIS — F331 Major depressive disorder, recurrent, moderate: Secondary | ICD-10-CM

## 2014-05-29 DIAGNOSIS — F419 Anxiety disorder, unspecified: Secondary | ICD-10-CM

## 2014-05-29 DIAGNOSIS — F17200 Nicotine dependence, unspecified, uncomplicated: Secondary | ICD-10-CM

## 2014-05-29 DIAGNOSIS — F5105 Insomnia due to other mental disorder: Secondary | ICD-10-CM

## 2014-05-29 MED ORDER — ESZOPICLONE 2 MG PO TABS: 2.0000 mg | ORAL_TABLET | Freq: Every evening | ORAL | Status: DC | PRN

## 2014-05-29 NOTE — Progress Notes (Signed)
Patient ID: Andrea Gallegos, female   DOB: Jul 23, 1980, 34 y.o.   MRN: [id number]  [place] [id number] Progress Note  Andrea Gallegos [id number] 34 y.o.  05/29/2014 4:42 PM  Chief Complaint: "alot of health stressors"  History of Present Illness: Pt was diagnosed with Fibromyalgia and is on Flexaril. Pt was found to have a liver mass. Pt is having trouble swallowing and may have a thyroidectomy.   "I am doing amazing". Mood is stable and she is doing well mental health wise. States she is not taking her meds b/c she doesn't like the way they make her feel after one month. Pt feels therapy and spirituality are more effective. Pt didn't want to lose her children so she lied to provider. Reports all previous meds have made her feel worse than before or made her apathetic.   Pt goes to [facility] every Sunday. She has a lot of social support and is praying daily.  Work remains a Runner, broadcasting/film/video. Pt goes her boys every other weekend. States they a have great relationship.   Denies depression. Pt gets more anxious and emotional around her period time. Overall she feels happy and at peace. She is growing personally. Sleep is good with Lunesta and she is getting about 6-7 hrs of sleep. Appetite is decreased and she is losing weight. Pt is working with GI to determine cause. Energy is on the low side. Denies anhedonia, crying spells, worthlessness, low motivation and isolation.   Anxiety is overall improved and she feels at peace with everything. States God has a plan for everything. At times she has moments of anxiety but she is working thru it.   Denies manic and hypomanic episodes including decreased need for sleep, increased energy, mood lability and impulsivity.   Pt is not drinking alcohol and denies SIB.   Pt is not taking Abilify and Paxil. She is taking Lunesta as prescribed and denies SE.   Suicidal Ideation: No Plan Formed: No Patient has means to carry out plan: No  Homicidal  Ideation: No Plan Formed: No Patient has means to carry out plan: No  Review of Systems: Psychiatric: Agitation: No Hallucination: No Depressed Mood: No Insomnia: No Hypersomnia: No Altered Concentration: Yes always had problems with focus.  Feels Worthless: No Grandiose Ideas: No Belief In Special Powers: No New/Increased Substance Abuse: No Compulsions: No  Neurologic: Headache: No Seizure: No Paresthesias: No  Review of Systems  Constitutional: Positive for weight loss. Negative for fever and chills.  HENT: Negative for congestion, ear pain, nosebleeds and sore throat.   Eyes: Negative for blurred vision, double vision, pain and redness.  Respiratory: Negative for cough, sputum production and wheezing.   Cardiovascular: Negative for chest pain, palpitations and leg swelling.  Gastrointestinal: Positive for nausea and abdominal pain. Negative for heartburn and vomiting.  Musculoskeletal: Positive for back pain, joint pain and neck pain.  Skin: Positive for itching. Negative for rash.  Neurological: Negative for dizziness, tremors, sensory change, seizures, loss of consciousness and headaches.  Endo/Heme/Allergies: Positive for environmental allergies.  Psychiatric/Behavioral: Negative for depression, suicidal ideas, hallucinations and substance abuse. The patient is not nervous/anxious and does not have insomnia.      Past Medical Family, Social History: Pt lives in [place] with her boyfriend. Pt is divorced and has 2 sons who live with their father. 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  reports that she has been smoking Cigarettes.  She  has a 15 pack-year smoking history. She has never used smokeless tobacco. She reports that she drinks alcohol but has not for last several months. She reports that she does not use illicit drugs.  Family History  Problem Relation Age of Onset  . Cancer Mother     squamous cell  . Depression  Mother   . Cancer Paternal Uncle   . Cancer Maternal Grandfather   . Diabetes Maternal Grandfather   . Cancer Paternal Grandmother   . Cancer Paternal Grandfather   . Depression Maternal Aunt   . Depression Cousin   . Suicidality Neg Hx    Past Medical History  Diagnosis Date  . Varicose veins   . Bipolar disorder   . Depression   . Fibromyalgia   . Liver mass     Outpatient Encounter Prescriptions as of 05/29/2014  Medication Sig  . ARIPiprazole (ABILIFY) 5 MG tablet Take 1 tablet (5 mg total) by mouth daily.  . cyclobenzaprine (FLEXERIL) 10 MG tablet Take 10 mg by mouth 3 (three) times daily as needed for muscle spasms.  . eszopiclone (LUNESTA) 2 MG TABS tablet Take 1 tablet (2 mg total) by mouth at bedtime as needed for sleep. Take immediately before bedtime  . gabapentin (NEURONTIN) 600 MG tablet Take 1 tablet (600 mg total) by mouth at bedtime as needed (For leg pain.).  Marland Kitchen naproxen sodium (ALEVE) 220 MG tablet Take 220 mg by mouth every 8 (eight) hours as needed (For pain.).  Marland Kitchen ondansetron (ZOFRAN) 8 MG tablet Take 8 mg by mouth 2 (two) times daily as needed for nausea or vomiting.  Marland Kitchen OVER THE COUNTER MEDICATION Take 1 scoop by mouth every morning. Plexus Slim Powder  . OVER THE COUNTER MEDICATION Take 1 capsule by mouth at bedtime. Plexus Bio Cleanse  . OVER THE COUNTER MEDICATION Take 1 capsule by mouth at bedtime. Plexus ProBio 5  . PARoxetine (PAXIL) 20 MG tablet Take 1 tablet (20 mg total) by mouth daily.  . pantoprazole (PROTONIX) 20 MG tablet Take 20 mg by mouth daily.    Past Psychiatric History/Hospitalization(s): Anxiety: No Bipolar Disorder: Yes Depression: Yes Mania: Yes Psychosis: No Schizophrenia: No Personality Disorder: No Hospitalization for psychiatric illness: Yes History of Electroconvulsive Shock Therapy: No Prior Suicide Attempts: No  Physical Exam: Constitutional:  BP 142/86 mmHg  Pulse 76  Ht 5' 7.5" (1.715 m)  Wt 156 lb (70.761 kg)  BMI  24.06 kg/m2  General Appearance: alert, oriented, no acute distress and well nourished  Musculoskeletal: Strength & Muscle Tone: within normal limits Gait & Station: normal Patient leans: N/A  Mental Status Examination/Evaluation: Objective: Attitude: Calm and cooperative  Appearance: Casual, appears to be stated age  Eye Contact::  Good  Speech:  Clear and Coherent and Normal Rate  Volume:  Normal  Mood: euthymic  Affect:  Full Range  Thought Process:  Goal Directed, Linear and Logical  Orientation:  Full (Time, Place, and Person)  Thought Content:  Negative  Suicidal Thoughts:  No  Homicidal Thoughts:  No  Judgement:  Fair  Insight:  Fair  Concentration: good  Memory: Immediate-intact Recent-intact Remote-intact  Recall: fair  Language: fair  Gait and Station: normal  [employer] of Knowledge: average  Psychomotor Activity:  Normal  Akathisia:  No  Handed:  Right  AIMS (if indicated):  Facial and Oral Movements  Muscles of Facial Expression: None, normal  Lips and Perioral Area: None, normal  Jaw: None, normal  Tongue: None, normal Extremity Movements:  Upper (arms, wrists, hands, fingers): None, normal  Lower (legs, knees, ankles, toes): None, normal,  Trunk Movements:  Neck, shoulders, hips: None, normal,  Overall Severity : Severity of abnormal movements (highest score from questions above): None, normal  Incapacitation due to abnormal movements: None, normal  Patient's awareness of abnormal movements (rate only patient's report): No Awareness, Dental Status  Current problems with teeth and/or dentures?: No  Does patient usually wear dentures?: No       Medical Decision Making (Choose Three): Established Problem, Stable/Improving (1), Review of Psycho-Social Stressors (1) and Review of Medication Regimen & Side Effects (2)  Assessment: AXIS I  MDD recurrent, moderate with anxiety; r/o Bipolar disorder, Nicotine dep   AXIS II  Deferred   AXIS III  Past  Medical History    Diagnosis  Date    .  Varicose veins     .  Bipolar disorder     .  Depression    AXIS IV  other psychosocial or environmental problems   AXIS V  51-60 moderate symptoms      Treatment Plan/Recommendations:  Plan of Care:  Medication management with supportive therapy. Risks/benefits and SE of the medication discussed. Pt verbalized understanding and verbal consent obtained for treatment.  Affirm with the patient that the medications are taken as ordered. Patient expressed understanding of how their medications were to be used.  -improvement of symptoms. Pt doesn't want antidepressants at this time. States mood is stable but she will continue to f/up with this provider. If symptoms worsen pt is open to restarting meds.    Laboratory:  no new labs at this time  Psychotherapy: Therapy: brief supportive therapy provided. Discussed psychosocial stressors in detail.    Advised diet and exercise.   Medications:  D/c Paxil per pt preference D/c Abilify  Per pt preference Lunesta 2mg  po qHS prn insomnia  Routine PRN Medications: No   Consultations: encouraged to f/up with PCP as needed. Encouraged to continue therapy  Safety Concerns: Pt denies SI and is at an acute low risk for suicide.Patient told to call clinic if any problems occur. Patient advised to go to ER if they should develop SI/HI, side effects, or if symptoms worsen. Has crisis numbers to call if needed. Pt verbalized understanding.   Other: F/up in 6 months or sooner if needed     Charlcie Cradle, MD 05/29/2014

## 2014-06-05 ENCOUNTER — Other Ambulatory Visit: Payer: Self-pay | Admitting: Otolaryngology

## 2014-06-13 ENCOUNTER — Telehealth (HOSPITAL_COMMUNITY): Payer: Self-pay | Admitting: *Deleted

## 2014-06-13 NOTE — Telephone Encounter (Signed)
Patient called left message on nurses voice mail.  Patient stated that a prior authorization needs to be done for her Lunesta.  I called and spoke with Judson Roch at [facility].  Per Judson Roch

## 2014-06-13 NOTE — Telephone Encounter (Signed)
I called the pharmacy. Advised pharmacist that the Andrea Gallegos will be covered at a 30 day supply not 64. The pharmacist stated that they can bump down a RX.  The pharmacist ran the RX for 30 day and it was cleared. I advised patient and advised patient RX ready for pick up

## 2014-06-13 NOTE — Telephone Encounter (Signed)
Per Judson Roch a prior authorization will need to be obtained.   I called [employer] and spoke with Jonie B.  Per Jonie the prior authorization was done.  Per Jonie the authorization came back due to the quantity. Per Wyvonna Plum the patient should only be getting 30 for 30---90 is not covered by the insurance.

## 2014-06-15 ENCOUNTER — Encounter (HOSPITAL_COMMUNITY): Payer: Self-pay

## 2014-06-15 ENCOUNTER — Encounter (HOSPITAL_COMMUNITY)
Admission: RE | Admit: 2014-06-15 | Discharge: 2014-06-15 | Disposition: A | Discharge status: Home / Self Care | Payer: BLUE CROSS/BLUE SHIELD | Source: Ambulatory Visit | Attending: Otolaryngology | Admitting: Otolaryngology

## 2014-06-15 DIAGNOSIS — F329 Major depressive disorder, single episode, unspecified: Secondary | ICD-10-CM | POA: Diagnosis not present

## 2014-06-15 DIAGNOSIS — E042 Nontoxic multinodular goiter: Secondary | ICD-10-CM | POA: Diagnosis not present

## 2014-06-15 DIAGNOSIS — M797 Fibromyalgia: Secondary | ICD-10-CM | POA: Diagnosis not present

## 2014-06-15 DIAGNOSIS — F319 Bipolar disorder, unspecified: Secondary | ICD-10-CM | POA: Diagnosis not present

## 2014-06-15 DIAGNOSIS — Z85828 Personal history of other malignant neoplasm of skin: Secondary | ICD-10-CM | POA: Diagnosis not present

## 2014-06-15 HISTORY — DX: Malignant (primary) neoplasm, unspecified: C80.1

## 2014-06-15 LAB — CBC
HCT: 39.2 % (ref 36.0–46.0)
Hemoglobin: 13.9 g/dL (ref 12.0–15.0)
MCH: 31 pg (ref 26.0–34.0)
MCHC: 35.5 g/dL (ref 30.0–36.0)
MCV: 87.3 fL (ref 78.0–100.0)
PLATELETS: 230 10*3/uL (ref 150–400)
RBC: 4.49 MIL/uL (ref 3.87–5.11)
RDW: 12.7 % (ref 11.5–15.5)
WBC: 6.8 10*3/uL (ref 4.0–10.5)

## 2014-06-15 LAB — HCG, SERUM, QUALITATIVE: PREG SERUM: NEGATIVE

## 2014-06-15 NOTE — Pre-Procedure Instructions (Signed)
Andrea Gallegos  06/15/2014   Your procedure is scheduled on:  Friday, May 20th   Report to [facility] Admitting at  5:30  AM.   Call this number if you have problems the morning of surgery: [phone]   Remember:   Do not eat food or drink liquids after midnight Thursday.   Take these medicines the morning of surgery with A SIP OF WATER: Gabapentin, Zofran, Protonix   Do not wear jewelry, make-up or nail polish.  Do not wear lotions, powders, or perfumes. You may NOT wear deodorant the day of surgery.  Do not shave underarms & legs 48 hours prior to surgery.    Do not bring valuables to the hospital.  [facility] is not responsible for any belongings or valuables.               Contacts, dentures or bridgework may not be worn into surgery.  Leave suitcase in the car. After surgery it may be brought to your room.  For patients admitted to the hospital, discharge time is determined by your treatment team.               Name and phone number of your driver:    Special Instructions: "Preparing for Surgery" instruction sheet.   Please read over the following fact sheets that you were given: Pain Booklet, Coughing and Deep Breathing and Surgical Site Infection Prevention

## 2014-06-22 ENCOUNTER — Ambulatory Visit (HOSPITAL_COMMUNITY): Payer: BLUE CROSS/BLUE SHIELD | Admitting: Certified Registered Nurse Anesthetist

## 2014-06-22 ENCOUNTER — Encounter (HOSPITAL_COMMUNITY)
Admission: RE | Disposition: A | Discharge status: Home / Self Care | Payer: Self-pay | Source: Ambulatory Visit | Attending: Otolaryngology

## 2014-06-22 ENCOUNTER — Ambulatory Visit (HOSPITAL_COMMUNITY): Payer: BLUE CROSS/BLUE SHIELD

## 2014-06-22 ENCOUNTER — Encounter (HOSPITAL_COMMUNITY): Payer: Self-pay | Admitting: General Practice

## 2014-06-22 ENCOUNTER — Observation Stay (HOSPITAL_COMMUNITY)
Admission: RE | Admit: 2014-06-22 | Discharge: 2014-06-23 | Disposition: A | Discharge status: Home / Self Care | Payer: BLUE CROSS/BLUE SHIELD | Source: Ambulatory Visit | Attending: Otolaryngology | Admitting: Otolaryngology

## 2014-06-22 DIAGNOSIS — M797 Fibromyalgia: Secondary | ICD-10-CM | POA: Insufficient documentation

## 2014-06-22 DIAGNOSIS — E042 Nontoxic multinodular goiter: Secondary | ICD-10-CM | POA: Diagnosis not present

## 2014-06-22 DIAGNOSIS — Z85828 Personal history of other malignant neoplasm of skin: Secondary | ICD-10-CM | POA: Insufficient documentation

## 2014-06-22 DIAGNOSIS — F329 Major depressive disorder, single episode, unspecified: Secondary | ICD-10-CM | POA: Insufficient documentation

## 2014-06-22 DIAGNOSIS — F319 Bipolar disorder, unspecified: Secondary | ICD-10-CM | POA: Insufficient documentation

## 2014-06-22 DIAGNOSIS — E079 Disorder of thyroid, unspecified: Secondary | ICD-10-CM | POA: Diagnosis present

## 2014-06-22 DIAGNOSIS — Z01818 Encounter for other preprocedural examination: Secondary | ICD-10-CM

## 2014-06-22 HISTORY — PX: THYROIDECTOMY: SHX17

## 2014-06-22 SURGERY — THYROIDECTOMY
Anesthesia: General | Site: Neck | Laterality: Left

## 2014-06-22 MED ORDER — FENTANYL CITRATE (PF) 250 MCG/5ML IJ SOLN
INTRAMUSCULAR | Status: AC
  Filled 2014-06-22: qty 5

## 2014-06-22 MED ORDER — PROMETHAZINE HCL 25 MG/ML IJ SOLN
12.5000 mg | INTRAMUSCULAR | Status: DC | PRN
  Administered 2014-06-22: 12.5 mg via INTRAVENOUS
  Filled 2014-06-22: qty 1

## 2014-06-22 MED ORDER — DEXAMETHASONE SODIUM PHOSPHATE 10 MG/ML IJ SOLN
INTRAMUSCULAR | Status: DC | PRN
  Administered 2014-06-22: 10 mg via INTRAVENOUS

## 2014-06-22 MED ORDER — ONDANSETRON HCL 4 MG PO TABS: 8.0000 mg | ORAL_TABLET | Freq: Two times a day (BID) | ORAL | Status: DC | PRN

## 2014-06-22 MED ORDER — PROMETHAZINE HCL 25 MG PO TABS: 25.0000 mg | ORAL_TABLET | Freq: Every evening | ORAL | Status: DC | PRN

## 2014-06-22 MED ORDER — PROMETHAZINE HCL 25 MG/ML IJ SOLN
INTRAMUSCULAR | Status: AC
  Filled 2014-06-22: qty 1

## 2014-06-22 MED ORDER — FENTANYL CITRATE (PF) 100 MCG/2ML IJ SOLN
INTRAMUSCULAR | Status: DC | PRN
  Administered 2014-06-22 (×3): 50 ug via INTRAVENOUS
  Administered 2014-06-22: 25 ug via INTRAVENOUS
  Administered 2014-06-22 (×4): 50 ug via INTRAVENOUS
  Administered 2014-06-22: 25 ug via INTRAVENOUS
  Administered 2014-06-22 (×2): 50 ug via INTRAVENOUS

## 2014-06-22 MED ORDER — STERILE WATER FOR INJECTION IJ SOLN
INTRAMUSCULAR | Status: AC
  Filled 2014-06-22: qty 10

## 2014-06-22 MED ORDER — PROPOFOL 10 MG/ML IV BOLUS
INTRAVENOUS | Status: DC | PRN
  Administered 2014-06-22 (×2): 30 mg via INTRAVENOUS
  Administered 2014-06-22: 140 mg via INTRAVENOUS

## 2014-06-22 MED ORDER — PROPOFOL 10 MG/ML IV BOLUS
INTRAVENOUS | Status: AC
  Filled 2014-06-22: qty 20

## 2014-06-22 MED ORDER — HYDROCODONE-ACETAMINOPHEN 5-325 MG PO TABS
1.0000 | ORAL_TABLET | ORAL | Status: DC | PRN
  Administered 2014-06-22 – 2014-06-23 (×4): 2 via ORAL
  Filled 2014-06-22 (×4): qty 2

## 2014-06-22 MED ORDER — LEVOFLOXACIN 500 MG PO TABS
500.0000 mg | ORAL_TABLET | Freq: Every day | ORAL | Status: DC
  Administered 2014-06-22: 500 mg via ORAL
  Filled 2014-06-22 (×3): qty 1

## 2014-06-22 MED ORDER — GABAPENTIN 600 MG PO TABS: 600.0000 mg | ORAL_TABLET | Freq: Every evening | ORAL | Status: DC | PRN

## 2014-06-22 MED ORDER — LIDOCAINE HCL (CARDIAC) 20 MG/ML IV SOLN
INTRAVENOUS | Status: AC
  Filled 2014-06-22: qty 5

## 2014-06-22 MED ORDER — MIDAZOLAM HCL 5 MG/5ML IJ SOLN
INTRAMUSCULAR | Status: DC | PRN
  Administered 2014-06-22: 2 mg via INTRAVENOUS

## 2014-06-22 MED ORDER — DEXTROSE-NACL 5-0.45 % IV SOLN
INTRAVENOUS | Status: DC
  Administered 2014-06-22: 14:00:00 via INTRAVENOUS

## 2014-06-22 MED ORDER — 0.9 % SODIUM CHLORIDE (POUR BTL) OPTIME
TOPICAL | Status: DC | PRN
  Administered 2014-06-22: 1000 mL

## 2014-06-22 MED ORDER — SUCCINYLCHOLINE CHLORIDE 20 MG/ML IJ SOLN
INTRAMUSCULAR | Status: AC
  Filled 2014-06-22: qty 1

## 2014-06-22 MED ORDER — EPHEDRINE SULFATE 50 MG/ML IJ SOLN
INTRAMUSCULAR | Status: AC
  Filled 2014-06-22: qty 1

## 2014-06-22 MED ORDER — ONDANSETRON HCL 4 MG/2ML IJ SOLN
INTRAMUSCULAR | Status: DC | PRN
  Administered 2014-06-22: 4 mg via INTRAVENOUS

## 2014-06-22 MED ORDER — LACTATED RINGERS IV SOLN
INTRAVENOUS | Status: DC | PRN
  Administered 2014-06-22 (×2): via INTRAVENOUS

## 2014-06-22 MED ORDER — DEXAMETHASONE SODIUM PHOSPHATE 10 MG/ML IJ SOLN
INTRAMUSCULAR | Status: AC
  Filled 2014-06-22: qty 1

## 2014-06-22 MED ORDER — CYCLOBENZAPRINE HCL 5 MG PO TABS: 5.0000 mg | ORAL_TABLET | Freq: Every day | ORAL | Status: DC | PRN

## 2014-06-22 MED ORDER — ZOLPIDEM TARTRATE 5 MG PO TABS
5.0000 mg | ORAL_TABLET | Freq: Every day | ORAL | Status: DC
  Administered 2014-06-22: 5 mg via ORAL
  Filled 2014-06-22: qty 1

## 2014-06-22 MED ORDER — SUCCINYLCHOLINE CHLORIDE 20 MG/ML IJ SOLN
INTRAMUSCULAR | Status: DC | PRN
  Administered 2014-06-22: 100 mg via INTRAVENOUS

## 2014-06-22 MED ORDER — LIDOCAINE HCL 4 % MT SOLN
OROMUCOSAL | Status: DC | PRN
  Administered 2014-06-22: 4 mL via TOPICAL

## 2014-06-22 MED ORDER — HYDROMORPHONE HCL 1 MG/ML IJ SOLN
INTRAMUSCULAR | Status: AC
Start: 2014-06-22 — End: 2014-06-22
  Administered 2014-06-22: 0.5 mg via INTRAVENOUS
  Filled 2014-06-22: qty 1

## 2014-06-22 MED ORDER — ONDANSETRON HCL 4 MG/2ML IJ SOLN
INTRAMUSCULAR | Status: AC
  Filled 2014-06-22: qty 2

## 2014-06-22 MED ORDER — LIDOCAINE HCL (CARDIAC) 20 MG/ML IV SOLN
INTRAVENOUS | Status: DC | PRN
  Administered 2014-06-22: 40 mg via INTRAVENOUS

## 2014-06-22 MED ORDER — MIDAZOLAM HCL 2 MG/2ML IJ SOLN
INTRAMUSCULAR | Status: AC
  Filled 2014-06-22: qty 2

## 2014-06-22 MED ORDER — HYDROMORPHONE HCL 1 MG/ML IJ SOLN
0.2500 mg | INTRAMUSCULAR | Status: DC | PRN
  Administered 2014-06-22 (×4): 0.5 mg via INTRAVENOUS

## 2014-06-22 MED ORDER — ROCURONIUM BROMIDE 50 MG/5ML IV SOLN
INTRAVENOUS | Status: AC
  Filled 2014-06-22: qty 1

## 2014-06-22 MED ORDER — ONDANSETRON HCL 4 MG/2ML IJ SOLN
4.0000 mg | Freq: Four times a day (QID) | INTRAMUSCULAR | Status: DC | PRN
  Administered 2014-06-22: 4 mg via INTRAVENOUS
  Filled 2014-06-22: qty 2

## 2014-06-22 MED ORDER — PROMETHAZINE HCL 25 MG/ML IJ SOLN
6.2500 mg | Freq: Once | INTRAMUSCULAR | Status: AC
  Administered 2014-06-22: 6.25 mg via INTRAVENOUS

## 2014-06-22 MED ORDER — PANTOPRAZOLE SODIUM 40 MG PO TBEC: 40.0000 mg | DELAYED_RELEASE_TABLET | Freq: Every day | ORAL | Status: DC

## 2014-06-22 MED ORDER — OSELTAMIVIR PHOSPHATE 75 MG PO CAPS
75.0000 mg | ORAL_CAPSULE | Freq: Two times a day (BID) | ORAL | Status: DC
  Filled 2014-06-22 (×2): qty 1

## 2014-06-22 SURGICAL SUPPLY — 60 items
ADH SKN CLS APL DERMABOND .7 (GAUZE/BANDAGES/DRESSINGS)
APPLIER CLIP 9.375 SM OPEN (CLIP) ×2
APR CLP SM 9.3 20 MLT OPN (CLIP) ×1
ATTRACTOMAT 16X20 MAGNETIC DRP (DRAPES) IMPLANT
BLADE SURG 15 STRL LF DISP TIS (BLADE) ×1 IMPLANT
BLADE SURG 15 STRL SS (BLADE) ×2
BLADE SURG ROTATE 9660 (MISCELLANEOUS) IMPLANT
CANISTER SUCTION 2500CC (MISCELLANEOUS) ×2 IMPLANT
CLEANER TIP ELECTROSURG 2X2 (MISCELLANEOUS) ×2 IMPLANT
CLIP APPLIE 9.375 SM OPEN (CLIP) ×1 IMPLANT
CONT SPEC 4OZ CLIKSEAL STRL BL (MISCELLANEOUS) ×3 IMPLANT
CORDS BIPOLAR (ELECTRODE) ×2 IMPLANT
COVER SURGICAL LIGHT HANDLE (MISCELLANEOUS) ×2 IMPLANT
CRADLE DONUT ADULT HEAD (MISCELLANEOUS) ×1 IMPLANT
DERMABOND ADVANCED (GAUZE/BANDAGES/DRESSINGS)
DERMABOND ADVANCED .7 DNX12 (GAUZE/BANDAGES/DRESSINGS) IMPLANT
DRAIN JACKSON RD 7FR 3/32 (WOUND CARE) ×2 IMPLANT
DRAPE PROXIMA HALF (DRAPES) IMPLANT
ELECT COATED BLADE 2.86 ST (ELECTRODE) ×2 IMPLANT
ELECT REM PT RETURN 9FT ADLT (ELECTROSURGICAL) ×2
ELECTRODE REM PT RTRN 9FT ADLT (ELECTROSURGICAL) ×1 IMPLANT
EVACUATOR SILICONE 100CC (DRAIN) ×2 IMPLANT
FORCEPS BIPOLAR SPETZLER 8 1.0 (NEUROSURGERY SUPPLIES) ×1 IMPLANT
GAUZE SPONGE 4X4 16PLY XRAY LF (GAUZE/BANDAGES/DRESSINGS) ×2 IMPLANT
GLOVE BIO SURGEON STRL SZ 6.5 (GLOVE) ×1 IMPLANT
GLOVE BIOGEL PI IND STRL 6.5 (GLOVE) IMPLANT
GLOVE BIOGEL PI IND STRL 7.0 (GLOVE) IMPLANT
GLOVE BIOGEL PI INDICATOR 6.5 (GLOVE) ×2
GLOVE BIOGEL PI INDICATOR 7.0 (GLOVE) ×1
GLOVE ECLIPSE 7.5 STRL STRAW (GLOVE) ×2 IMPLANT
GLOVE SURG SS PI 6.5 STRL IVOR (GLOVE) ×2 IMPLANT
GLOVE SURG SS PI 7.0 STRL IVOR (GLOVE) ×1 IMPLANT
GOWN STRL REUS W/ TWL LRG LVL3 (GOWN DISPOSABLE) ×2 IMPLANT
GOWN STRL REUS W/TWL LRG LVL3 (GOWN DISPOSABLE) ×12
KIT BASIN OR (CUSTOM PROCEDURE TRAY) ×2 IMPLANT
KIT ROOM TURNOVER OR (KITS) ×2 IMPLANT
LIQUID BAND (GAUZE/BANDAGES/DRESSINGS) ×1 IMPLANT
LOCATOR NERVE 3 VOLT (DISPOSABLE) IMPLANT
NDL HYPO 25GX1X1/2 BEV (NEEDLE) IMPLANT
NEEDLE HYPO 25GX1X1/2 BEV (NEEDLE) IMPLANT
NS IRRIG 1000ML POUR BTL (IV SOLUTION) ×2 IMPLANT
PAD ARMBOARD 7.5X6 YLW CONV (MISCELLANEOUS) ×3 IMPLANT
PENCIL FOOT CONTROL (ELECTRODE) ×2 IMPLANT
PROBE NERVBE PRASS .33 (MISCELLANEOUS) ×1 IMPLANT
SHEARS HARMONIC 9CM CVD (BLADE) ×2 IMPLANT
SPONGE INTESTINAL PEANUT (DISPOSABLE) ×1 IMPLANT
STAPLER VISISTAT 35W (STAPLE) ×2 IMPLANT
SUT CHROMIC 4 0 PS 2 18 (SUTURE) ×5 IMPLANT
SUT ETHILON 3 0 PS 1 (SUTURE) ×2 IMPLANT
SUT SILK 2 0 (SUTURE) ×2
SUT SILK 2-0 18XBRD TIE 12 (SUTURE) ×1 IMPLANT
SUT SILK 4 0 (SUTURE) ×2
SUT SILK 4-0 18XBRD TIE 12 (SUTURE) ×1 IMPLANT
TOWEL OR 17X24 6PK STRL BLUE (TOWEL DISPOSABLE) ×2 IMPLANT
TRAY ENT MC OR (CUSTOM PROCEDURE TRAY) ×2 IMPLANT
TRAY FOLEY CATH 16FR SILVER (SET/KITS/TRAYS/PACK) IMPLANT
TUBE ENDOTRAC EMG 7X10.2 (MISCELLANEOUS) ×1 IMPLANT
TUBE ENDOTRAC EMG 8X11.3 (MISCELLANEOUS) IMPLANT
TUBE ENDOTRACH  EMG 6MMTUBE EN (MISCELLANEOUS)
TUBE ENDOTRACH EMG 6MMTUBE EN (MISCELLANEOUS) IMPLANT

## 2014-06-22 NOTE — H&P (Signed)
Andrea Gallegos is an 34 y.o. female.   Chief Complaint: thyroid mass HPI: hx of mass and now wants to have it removed. It is about 4 cm.   Past Medical History  Diagnosis Date  . Varicose veins   . Depression   . Fibromyalgia   . Liver mass   . Cancer     basal cell cancer  . Bipolar disorder     Past Surgical History  Procedure Laterality Date  . Cesarean section      X 2  . Endovenous ablation saphenous vein w/ laser Left 06-12-2009    left greater saphenous vein  . Endovenous ablation saphenous vein w/ laser Right 07-10-2009    right greater saphenous vein  . Bladder surgery      at age 31  . Dilation and curettage of uterus    . Biopsy thyroid    . Tubal ligation      Family History  Problem Relation Age of Onset  . Cancer Mother     squamous cell  . Depression Mother   . Cancer Paternal Uncle   . Cancer Maternal Grandfather   . Diabetes Maternal Grandfather   . Cancer Paternal Grandmother   . Cancer Paternal Grandfather   . Depression Maternal Aunt   . Depression Cousin   . Suicidality Neg Hx    Social History:  reports that she has been smoking Cigarettes.  She has a 3.75 pack-year smoking history. She has never used smokeless tobacco. She reports that she does not drink alcohol or use illicit drugs.  Allergies:  Allergies  Allergen Reactions  . Bacitracin Hives and Itching  . Other Other (See Comments)    All Birth Control Pills - itching, rash  . Neosporin [Neomycin-Polymyxin-Gramicidin] Rash    Medications Prior to Admission  Medication Sig Dispense Refill  . cyclobenzaprine (FLEXERIL) 5 MG tablet Take 5 mg by mouth daily as needed for muscle spasms.     . eszopiclone (LUNESTA) 2 MG TABS tablet Take 1 tablet (2 mg total) by mouth at bedtime as needed for sleep. Take immediately before bedtime (Patient taking differently: Take 2 mg by mouth at bedtime. Take immediately before bedtime) 90 tablet 1  . gabapentin (NEURONTIN) 600 MG tablet Take 1 tablet  (600 mg total) by mouth at bedtime as needed (For leg pain.). 30 tablet 0  . levofloxacin (LEVAQUIN) 500 MG tablet Take 500 mg by mouth daily. 14 day course filled on 06/11/14    . ondansetron (ZOFRAN) 8 MG tablet Take 8 mg by mouth 2 (two) times daily as needed for nausea or vomiting.    Marland Kitchen oseltamivir (TAMIFLU) 75 MG capsule Take 75 mg by mouth every 12 (twelve) hours.    . pantoprazole (PROTONIX) 40 MG tablet Take 40 mg by mouth daily.    . promethazine (PHENERGAN) 25 MG tablet Take 25 mg by mouth at bedtime as needed for nausea or vomiting.     . naproxen sodium (ALEVE) 220 MG tablet Take 220 mg by mouth daily as needed (For pain.).       No results found for this or any previous visit (from the past 48 hour(s)). Dg Chest 2 View  06/22/2014   CLINICAL DATA:  Preop for partial thyroidectomy.  EXAM: CHEST  2 VIEW  COMPARISON:  None.  FINDINGS: The cardiomediastinal contours are normal. The lungs are clear. Pulmonary vasculature is normal. No consolidation, pleural effusion, or pneumothorax. No acute osseous abnormalities are seen.  IMPRESSION: Clear  lungs.  No acute pulmonary process.   Electronically Signed   By: Jeb Levering M.D.   On: 06/22/2014 06:45    Review of Systems  Constitutional: Negative.   HENT: Negative.   Eyes: Negative.   Respiratory: Negative.   Cardiovascular: Negative.   Musculoskeletal: Negative.   Skin: Negative.     Blood pressure 127/81, pulse 98, temperature 97.8 F (36.6 C), temperature source Oral, resp. rate 20, height 5' 7.5" (1.715 m), weight 70.761 kg (156 lb), last menstrual period 06/03/2014, SpO2 100 %. Physical Exam  Constitutional: She appears well-developed and well-nourished.  HENT:  Head: Normocephalic.  Nose: Nose normal.  Mouth/Throat: Oropharynx is clear and moist.  Eyes: Conjunctivae are normal. Pupils are equal, round, and reactive to light.  Neck: Normal range of motion. Neck supple.  Cardiovascular: Normal rate.   Respiratory: Effort  normal.  GI: Soft.  Musculoskeletal: Normal range of motion.     Assessment/Plan Thyroid mass- she wants to proceed with thyroidectomy and procedure discussed. Ready to proceed  Melissa Montane 06/22/2014, 7:16 AM

## 2014-06-22 NOTE — Op Note (Signed)
Reoperative/postop diagnosis: Left thyroid mass Procedure: Left thyroidectomy Anesthesia: Gen. Estimated blood loss: Less than 10 mL Asst: Jolene Provost Indications: A 34 year old with a mass in her left thyroid that has been evaluated with ultrasound and it is approximately 3.8 cm. She has had discussion and has decided to proceed with removal. The risks, benefits, and options of the procedure were discussed. All her questions are answered and consent was obtained. Procedure: Patient was taken to the operating room placed in the supine position after general endotracheal tube anesthesia with the Nims monitor tube the patient was prepped and draped in the usual sterile manner. An incision was made at the base of the neck in a skin crease in a subplatysmal flap was elevated inferior and superiorly. The retractor was placed. The midline was identified and the fascia and strap muscles were divided in the midline. The left side was identified easily and the thyroid was dissected along its capsule with layer dissection carrying it from medial to lateral. The gland was rotated medially and the upper portion of the thyroid was carefully dissected and divided with the harmonic scalpel. The inferior thyroid artery as well as the veins were identified and there was parathyroid glands in that location all of which were preserved and the artery was divided. The nerve was identified using the stimulator to confirm its location and function. It functioned well. It was carefully avoided and dissection was carried out to Berry's ligament and the gland was divided at its isthmus. Frozen section was benign thyroid lesion. The wound was irrigated with saline. The nerve was again stimulated with the stimulator inferiorly and the vocal cords were stimulated per the monitor. The #7 JP drain was placed and secured laterally to the wound with a 3-0 nylon. Subplatysmal and cutaneous flap was closed with interrupted 4-0 chromic and  Dermabond to close the skin. She was awakened brought to recovery room in stable condition counts correct

## 2014-06-22 NOTE — Anesthesia Postprocedure Evaluation (Signed)
  Anesthesia Post-op Note  Patient: Andrea Gallegos  Procedure(s) Performed: Procedure(s): THYROIDECTOMY (Left)  Patient Location: PACU  Anesthesia Type:General  Level of Consciousness: awake and alert   Airway and Oxygen Therapy: Patient Spontanous Breathing  Post-op Pain: moderate  Post-op Assessment: Post-op Vital signs reviewed, Patient's Cardiovascular Status Stable and Respiratory Function Stable  Post-op Vital Signs: Reviewed  Filed Vitals:   06/22/14 1044  BP:   Pulse: 97  Temp:   Resp: 20    Complications: No apparent anesthesia complications

## 2014-06-22 NOTE — Anesthesia Procedure Notes (Addendum)
Procedure Name: Intubation Date/Time: 06/22/2014 7:47 AM Performed by: ,  T Pre-anesthesia Checklist: Patient identified, Emergency Drugs available, Suction available and Patient being monitored Patient Re-evaluated:Patient Re-evaluated prior to inductionOxygen Delivery Method: Circle system utilized Preoxygenation: Pre-oxygenation with 100% oxygen Intubation Type: IV induction Ventilation: Mask ventilation without difficulty Laryngoscope Size: Miller and 2 Grade View: Grade I Tube type: Oral (NIM endotracheal tube) Tube size: 7.0 mm Number of attempts: 1 Airway Equipment and Method: Stylet and LTA kit utilized Placement Confirmation: ETT inserted through vocal cords under direct vision,  positive ETCO2 and breath sounds checked- equal and bilateral Secured at: 21 cm Tube secured with: Tape Dental Injury: Teeth and Oropharynx as per pre-operative assessment      

## 2014-06-22 NOTE — Anesthesia Preprocedure Evaluation (Addendum)
Anesthesia Evaluation  Patient identified by MRN, date of birth, ID band Patient awake    Reviewed: Allergy & Precautions, H&P , NPO status , Patient's Chart, lab work & pertinent test results  Airway Mallampati: II  TM Distance: >3 FB Neck ROM: Full    Dental no notable dental hx. (+) Teeth Intact, Dental Advisory Given   Pulmonary Current Smoker,  breath sounds clear to auscultation  Pulmonary exam normal       Cardiovascular negative cardio ROS  Rhythm:Regular Rate:Normal     Neuro/Psych Depression Bipolar Disorder negative neurological ROS     GI/Hepatic negative GI ROS, Neg liver ROS,   Endo/Other  negative endocrine ROS  Renal/GU negative Renal ROS  negative genitourinary   Musculoskeletal  (+) Fibromyalgia -  Abdominal   Peds  Hematology negative hematology ROS (+)   Anesthesia Other Findings   Reproductive/Obstetrics negative OB ROS                            Anesthesia Physical Anesthesia Plan  ASA: II  Anesthesia Plan: General   Post-op Pain Management:    Induction: Intravenous  Airway Management Planned: Oral ETT  Additional Equipment:   Intra-op Plan:   Post-operative Plan: Extubation in OR  Informed Consent: I have reviewed the patients History and Physical, chart, labs and discussed the procedure including the risks, benefits and alternatives for the proposed anesthesia with the patient or authorized representative who has indicated his/her understanding and acceptance.   Dental advisory given  Plan Discussed with: CRNA  Anesthesia Plan Comments:         Anesthesia Quick Evaluation

## 2014-06-22 NOTE — Progress Notes (Signed)
   ENT Progress Note: s/p Procedure(s): HEMI-THYROIDECTOMY   Subjective: Tol po, no pain  Objective: Vital signs in last 24 hours: Temp:  [97.8 F (36.6 C)-98.3 F (36.8 C)] 98 F (36.7 C) (05/20 1211) Pulse Rate:  [80-120] 89 (05/20 1211) Resp:  [8-20] 14 (05/20 1211) BP: (107-137)/(51-91) 130/75 mmHg (05/20 1211) SpO2:  [100 %] 100 % (05/20 1211) Weight:  [70.761 kg (156 lb)-76.204 kg (168 lb)] 76.204 kg (168 lb) (05/20 1211) Weight change:     Intake/Output from previous day:   Intake/Output this shift: Total I/O In: 1940 [P.O.:240; I.V.:1700] Out: 80 [Urine:900; Drains:10; Blood:20]  Labs: No results for input(s): WBC, HGB, HCT, PLT in the last 72 hours. No results for input(s): NA, K, CL, CO2, GLUCOSE, BUN, CALCIUM in the last 72 hours.  Invalid input(s): CREATININR  Studies/Results: Dg Chest 2 View  06/22/2014   CLINICAL DATA:  Preop for partial thyroidectomy.  EXAM: CHEST  2 VIEW  COMPARISON:  None.  FINDINGS: The cardiomediastinal contours are normal. The lungs are clear. Pulmonary vasculature is normal. No consolidation, pleural effusion, or pneumothorax. No acute osseous abnormalities are seen.  IMPRESSION: Clear lungs.  No acute pulmonary process.   Electronically Signed   By: Jeb Levering M.D.   On: 06/22/2014 06:45     PHYSICAL EXAM: Inc intact, no swelling JP intact   Assessment/Plan: O/N obs  Plan d/c in am    Andrea Gallegos 06/22/2014, 5:25 PM

## 2014-06-22 NOTE — Transfer of Care (Signed)
Immediate Anesthesia Transfer of Care Note  Patient: Andrea Gallegos  Procedure(s) Performed: Procedure(s): THYROIDECTOMY (Left)  Patient Location: PACU  Anesthesia Type:General  Level of Consciousness: awake, alert  and oriented  Airway & Oxygen Therapy: Patient Spontanous Breathing and Patient connected to nasal cannula oxygen  Post-op Assessment: Report given to RN, Post -op Vital signs reviewed and stable and Patient moving all extremities X 4  Post vital signs: Reviewed and stable  Last Vitals:  Filed Vitals:   06/22/14 0958  BP:   Pulse:   Temp: 36.7 C  Resp:     Complications: No apparent anesthesia complications

## 2014-06-23 DIAGNOSIS — E042 Nontoxic multinodular goiter: Secondary | ICD-10-CM | POA: Diagnosis not present

## 2014-06-23 MED ORDER — OXYCODONE-ACETAMINOPHEN 5-325 MG PO TABS: 1.0000 | ORAL_TABLET | ORAL | Status: DC | PRN

## 2014-06-23 NOTE — Discharge Summary (Signed)
Physician Discharge Summary  Patient ID: Andrea Gallegos MRN: [id number] DOB/AGE: [birth year] 34 y.o.  Admit date: 06/22/2014 Discharge date: 06/23/2014  Admission Diagnoses:  Active Problems:   Thyroid mass   Discharge Diagnoses:  Same  Surgeries: Procedure(s): THYROIDECTOMY on 06/22/2014   Consultants: None  Discharged Condition: Improved  Hospital Course: Andrea Gallegos is an 34 y.o. female who was admitted 06/22/2014 with a diagnosis of thyroid mass and went to the operating room on 06/22/2014 and underwent Left thyroidectomy.   Stable postop, d/c to home pod #1.  Recent vital signs:  Filed Vitals:   06/23/14 0519  BP: 145/90  Pulse: 90  Temp: 98 F (36.7 C)  Resp: 18    Recent laboratory studies:  Results for orders placed or performed during the hospital encounter of 06/15/14  CBC  Result Value Ref Range   WBC 6.8 4.0 - 10.5 K/uL   RBC 4.49 3.87 - 5.11 MIL/uL   Hemoglobin 13.9 12.0 - 15.0 g/dL   HCT 39.2 36.0 - 46.0 %   MCV 87.3 78.0 - 100.0 fL   MCH 31.0 26.0 - 34.0 pg   MCHC 35.5 30.0 - 36.0 g/dL   RDW 12.7 11.5 - 15.5 %   Platelets 230 150 - 400 K/uL  hCG, serum, qualitative  Result Value Ref Range   Preg, Serum NEGATIVE NEGATIVE    Discharge Medications:     Medication List    TAKE these medications        ALEVE 220 MG tablet  Generic drug:  naproxen sodium  Take 220 mg by mouth daily as needed (For pain.).     cyclobenzaprine 5 MG tablet  Commonly known as:  FLEXERIL  Take 5 mg by mouth daily as needed for muscle spasms.     eszopiclone 2 MG Tabs tablet  Commonly known as:  LUNESTA  Take 1 tablet (2 mg total) by mouth at bedtime as needed for sleep. Take immediately before bedtime     gabapentin 600 MG tablet  Commonly known as:  NEURONTIN  Take 1 tablet (600 mg total) by mouth at bedtime as needed (For leg pain.).     levofloxacin 500 MG tablet  Commonly known as:  LEVAQUIN  Take 500 mg by mouth daily. 14 day course filled on 06/11/14      ondansetron 8 MG tablet  Commonly known as:  ZOFRAN  Take 8 mg by mouth 2 (two) times daily as needed for nausea or vomiting.     oseltamivir 75 MG capsule  Commonly known as:  TAMIFLU  Take 75 mg by mouth every 12 (twelve) hours.     oxyCODONE-acetaminophen 5-325 MG per tablet  Commonly known as:  PERCOCET  Take 1-2 tablets by mouth every 4 (four) hours as needed for severe pain.     pantoprazole 40 MG tablet  Commonly known as:  PROTONIX  Take 40 mg by mouth daily.     promethazine 25 MG tablet  Commonly known as:  PHENERGAN  Take 25 mg by mouth at bedtime as needed for nausea or vomiting.        Diagnostic Studies: Dg Chest 2 View  06/22/2014   CLINICAL DATA:  Preop for partial thyroidectomy.  EXAM: CHEST  2 VIEW  COMPARISON:  None.  FINDINGS: The cardiomediastinal contours are normal. The lungs are clear. Pulmonary vasculature is normal. No consolidation, pleural effusion, or pneumothorax. No acute osseous abnormalities are seen.  IMPRESSION: Clear lungs.  No acute pulmonary process.  Electronically Signed   By: Jeb Levering M.D.   On: 06/22/2014 06:45    Disposition: 01-Home or Self Care      Discharge Instructions    Diet - low sodium heart healthy    Complete by:  As directed      Discharge instructions    Complete by:  As directed   1. Limited activity 2. Liquid and soft diet, advance as tolerated 3. May bathe and shower day after surgery 4. Wound care - Gentle cleaning with soap and water 5. DO NOT APPLY ANY OINTMENT 6. Elevate Head of Bed     Increase activity slowly    Complete by:  As directed               Signed: Ziyanna Tolin 06/23/2014, 8:37 AM

## 2014-06-23 NOTE — Progress Notes (Signed)
   ENT Progress Note: POD # 1 s/p Procedure(s): THYROIDECTOMY   Subjective: C/O mod pain, tol po  Objective: Vital signs in last 24 hours: Temp:  [97.8 F (36.6 C)-98.3 F (36.8 C)] 98 F (36.7 C) (05/21 0519) Pulse Rate:  [80-120] 90 (05/21 0519) Resp:  [8-20] 18 (05/21 0519) BP: (107-154)/(51-93) 145/90 mmHg (05/21 0519) SpO2:  [98 %-100 %] 100 % (05/21 0519) Weight:  [76.204 kg (168 lb)] 76.204 kg (168 lb) (05/20 1211) Weight change: 5.443 kg (12 lb)    Intake/Output from previous day: 05/20 0701 - 05/21 0700 In: 2333.7 [P.O.:240; I.V.:2093.7] Out: 1640 [Urine:1600; Drains:20; Blood:20] Intake/Output this shift:    Labs: No results for input(s): WBC, HGB, HCT, PLT in the last 72 hours. No results for input(s): NA, K, CL, CO2, GLUCOSE, BUN, CALCIUM in the last 72 hours.  Invalid input(s): CREATININR  Studies/Results: Dg Chest 2 View  06/22/2014   CLINICAL DATA:  Preop for partial thyroidectomy.  EXAM: CHEST  2 VIEW  COMPARISON:  None.  FINDINGS: The cardiomediastinal contours are normal. The lungs are clear. Pulmonary vasculature is normal. No consolidation, pleural effusion, or pneumothorax. No acute osseous abnormalities are seen.  IMPRESSION: Clear lungs.  No acute pulmonary process.   Electronically Signed   By: Jeb Levering M.D.   On: 06/22/2014 06:45     PHYSICAL EXAM: Inc intact No swelling or erythema JP removed   Assessment/Plan: Stable  D/C to home    [place], Gisell Buehrle 06/23/2014, 8:31 AM

## 2014-06-25 ENCOUNTER — Encounter (HOSPITAL_COMMUNITY): Payer: Self-pay | Admitting: Otolaryngology

## 2014-11-29 ENCOUNTER — Ambulatory Visit (HOSPITAL_COMMUNITY): Payer: Self-pay | Admitting: Psychiatry

## 2015-03-05 ENCOUNTER — Other Ambulatory Visit (HOSPITAL_COMMUNITY): Payer: Self-pay | Admitting: Psychiatry

## 2016-01-11 IMAGING — NM NM HEPATO W/GB/PHARM/[PERSON_NAME]
3 series · 13 of 13 positions shown · non-contrast
Comparison: Abdominal ultrasound April 09, 2014.

CLINICAL DATA: Right upper quadrant tenderness nausea and weight
loss

EXAM:
NUCLEAR MEDICINE HEPATOBILIARY IMAGING WITH GALLBLADDER EF
TECHNIQUE: Sequential images of the abdomen were obtained [DATE] minutes
following intravenous administration of radiopharmaceutical. After
slow intravenous infusion of 1.57 micrograms Cholecystokinin,
gallbladder ejection fraction was determined.
RADIOPHARMACEUTICALS:  Five Millicurie 5c-XXm Choletec

[he hepatobiliary · 3.43mm/px · 6 of 60 frames shown (1 of 3)]
[frame 6/60]
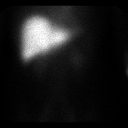
[frame 16/60]
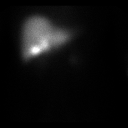
[frame 26/60]
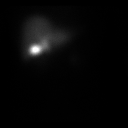
[frame 36/60]
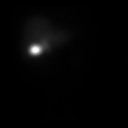
[frame 46/60]
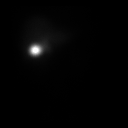
[frame 56/60]
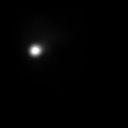

[he hepatobiliary · 3.43mm/px · 6 of 45 frames shown (2 of 3)]
[frame 4/45]
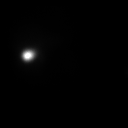
[frame 12/45]
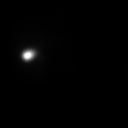
[frame 19/45]
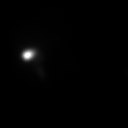
[frame 27/45]
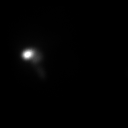
[frame 34/45]
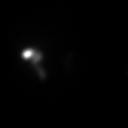
[frame 42/45]
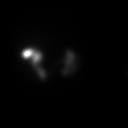

[he hepatobiliary · 1 of 1 slices shown (3 of 3)]
[im 1/1]
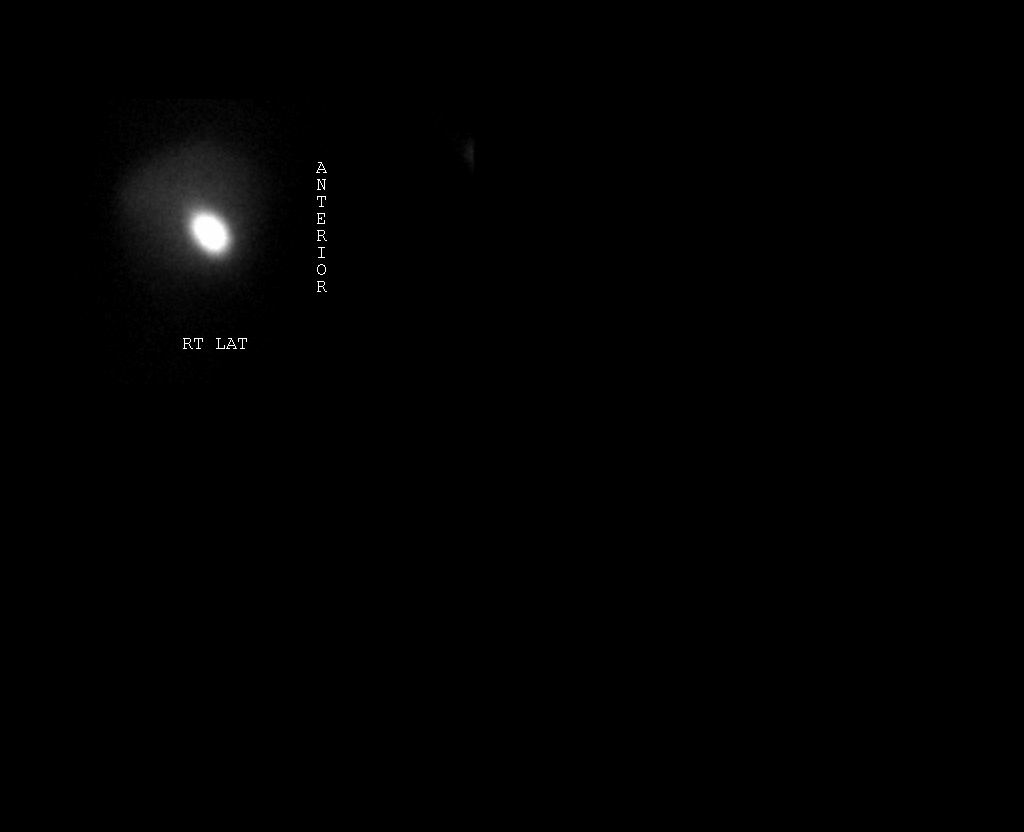

[13 of 13 positions shown; findings below may reference images not displayed]

FINDINGS: There is adequate uptake of the radiopharmaceutical by the liver.
The intrahepatic ducts and gallbladder are visible by 15 minutes.
The common bile duct is visible by approximately 25 minutes. No
bowel activity is seen at 60 minutes..

Following CCK administration the 45 minutes ejection fraction is
50%. At 45 min, normal ejection fraction is greater than 40%. The
patient experienced no symptoms during CCK administration. Bowel
activity was clearly evident on the post CCK images.
IMPRESSION: Normal hepatobiliary scan and normal gallbladder ejection fraction.

## 2016-03-17 IMAGING — CR DG CHEST 2V
2 series · 2 of 2 positions shown · non-contrast
Comparison: None.

CLINICAL DATA: Preop for partial thyroidectomy.

EXAM:
CHEST  2 VIEW

[w chest pa]
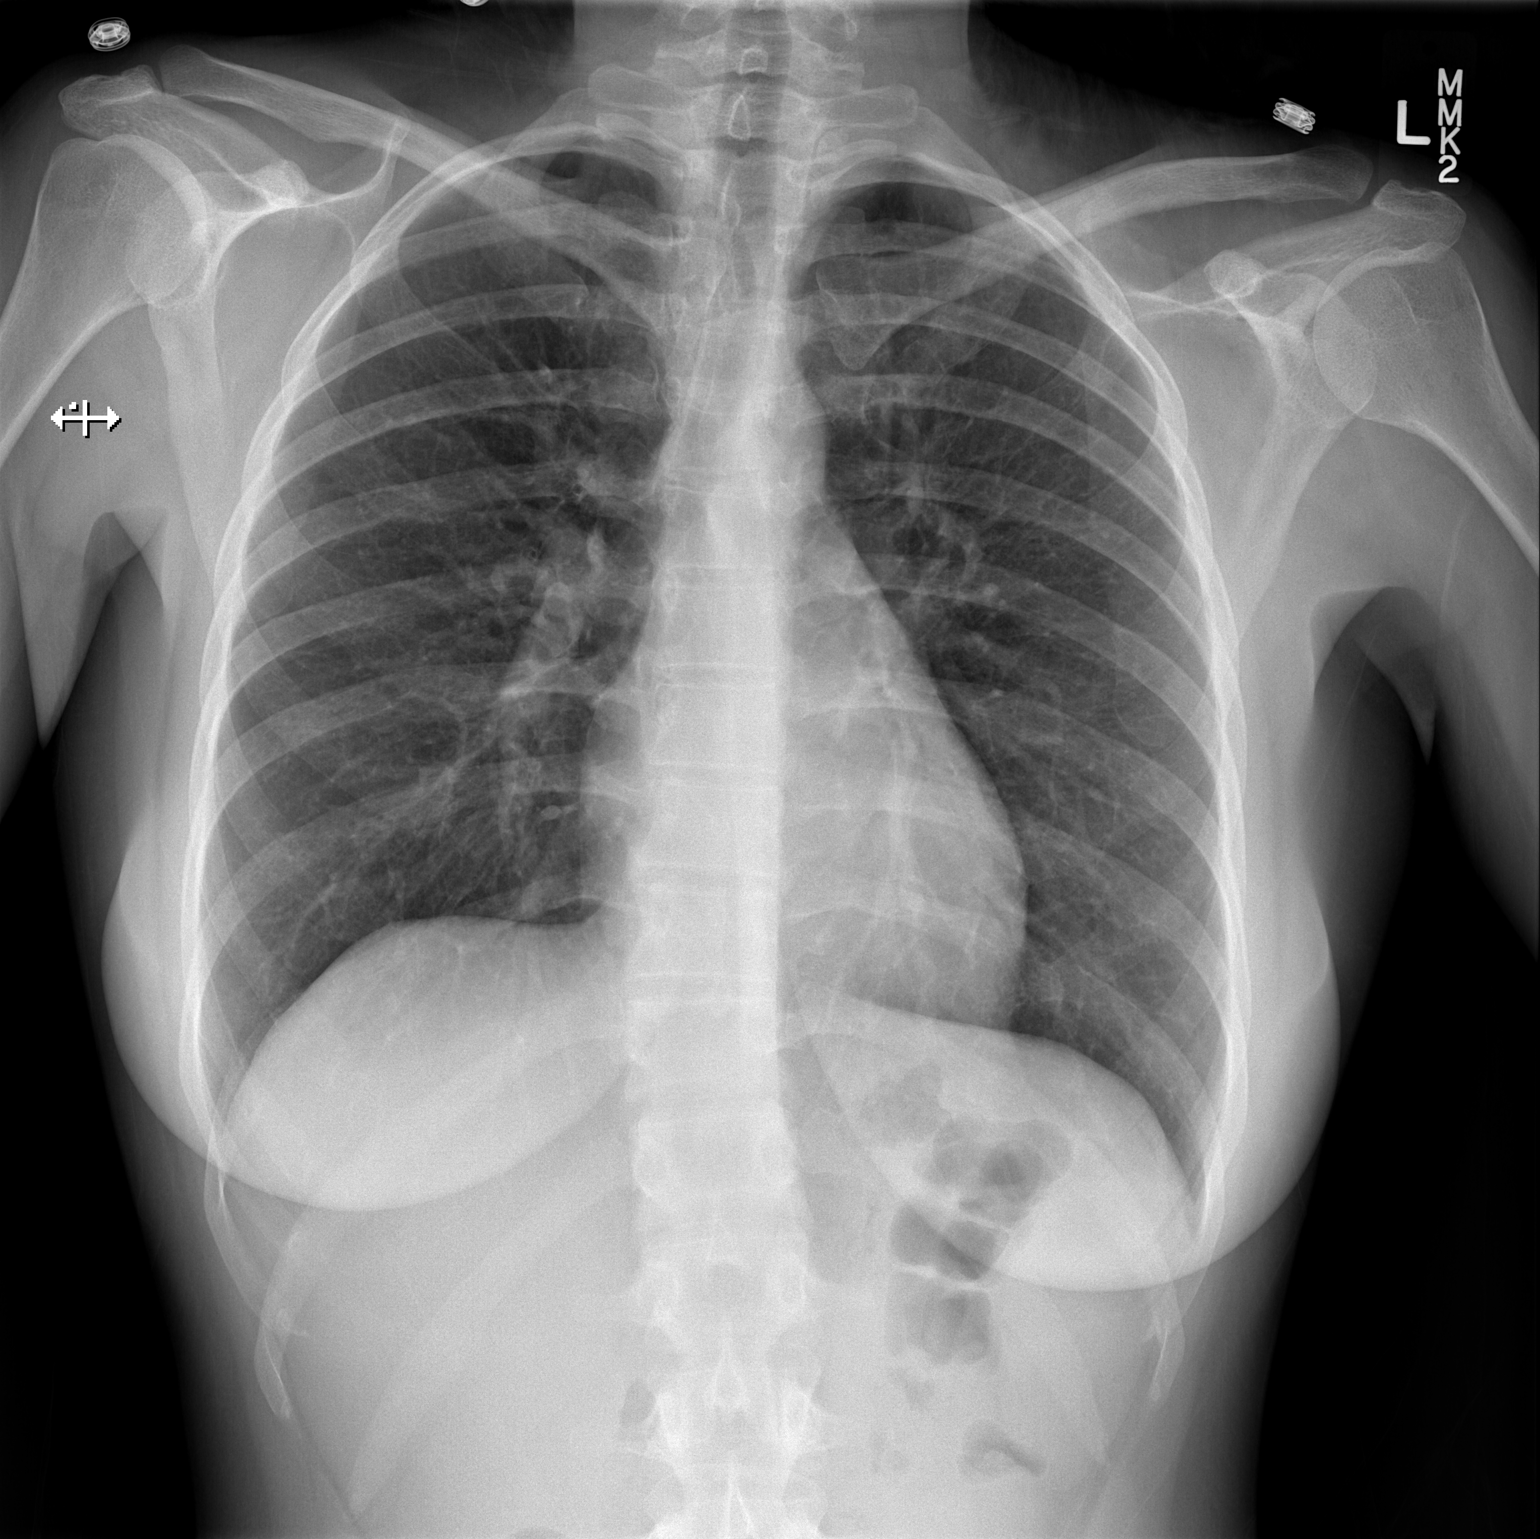

[w chest lat]
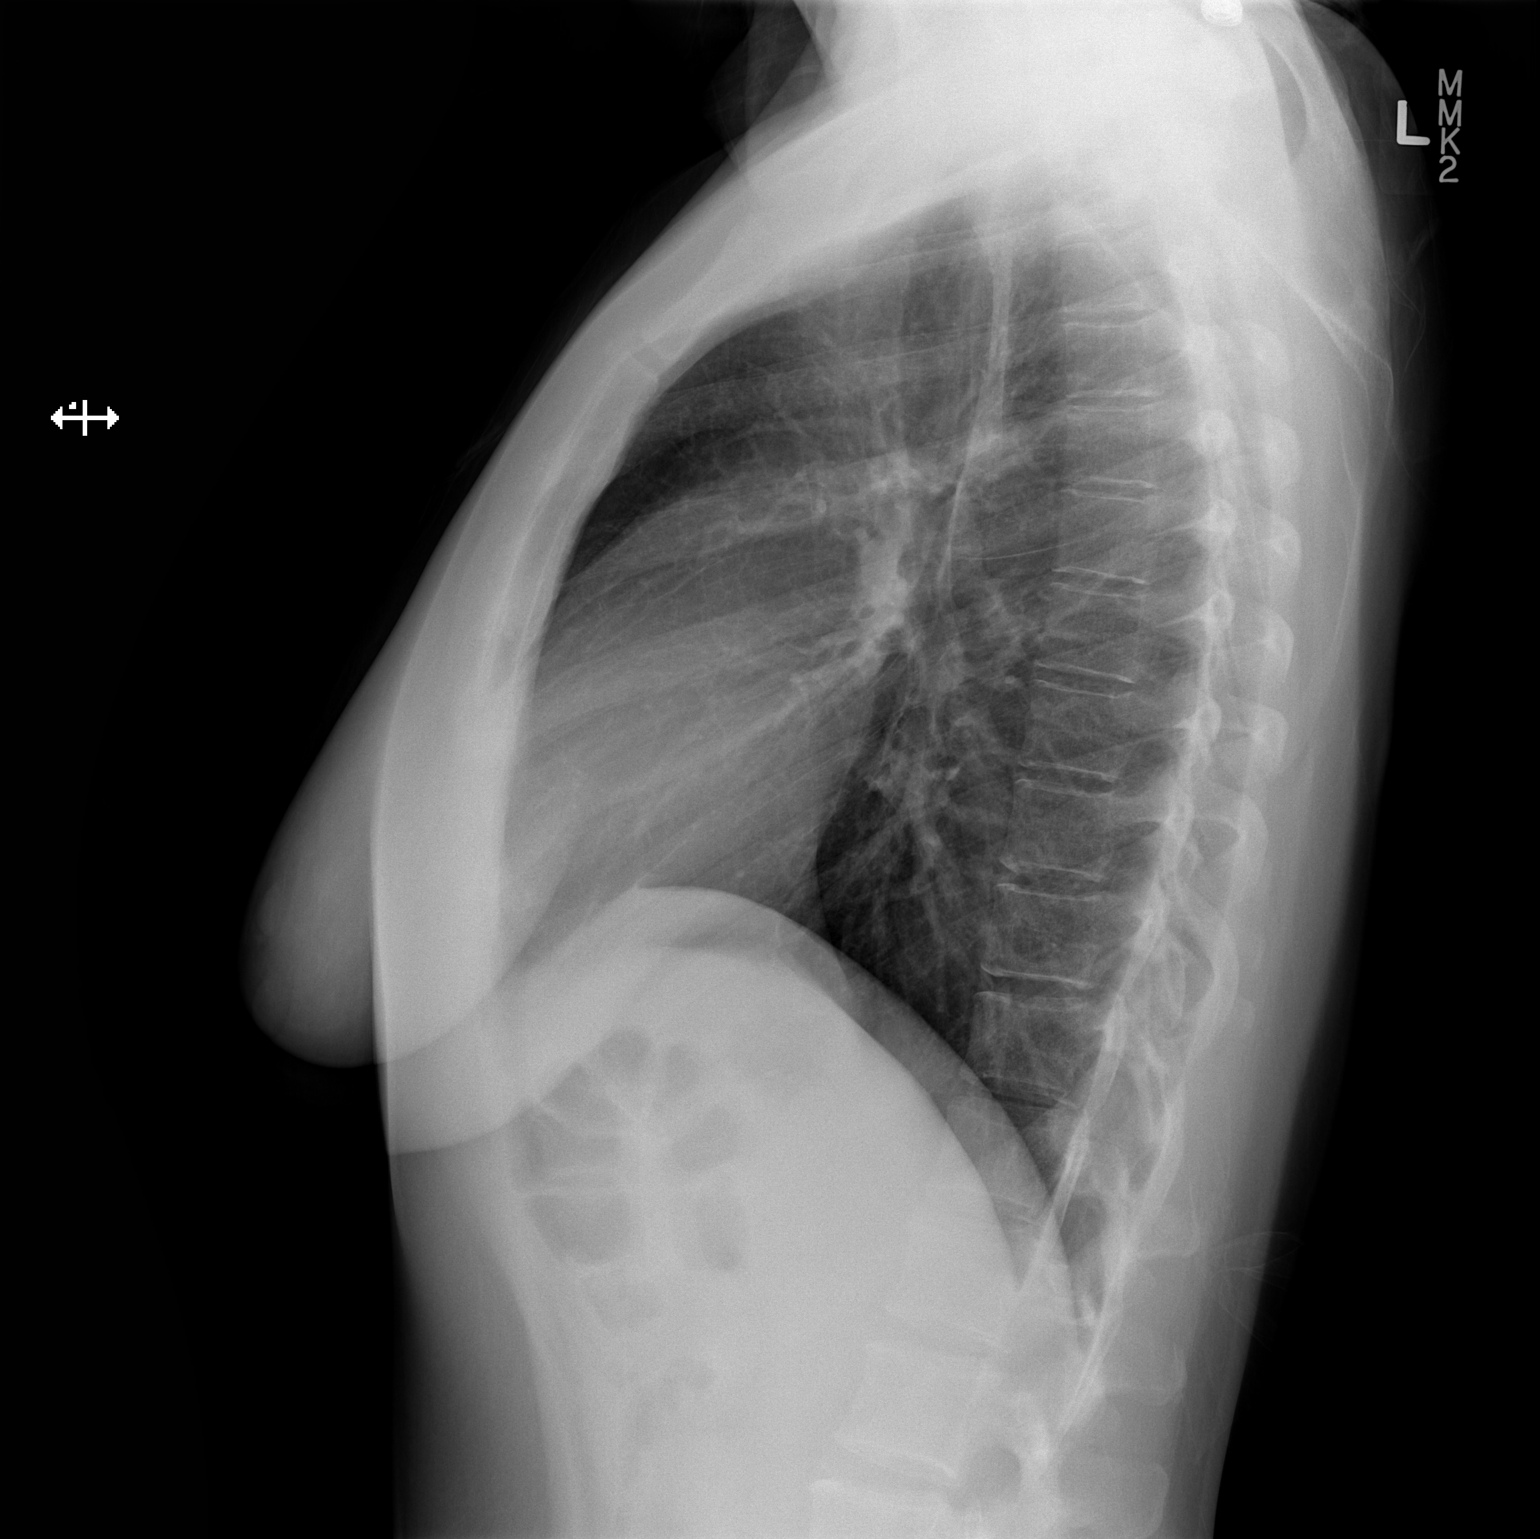

[2 of 2 positions shown; findings below may reference images not displayed]

FINDINGS: The cardiomediastinal contours are normal. The lungs are clear.
Pulmonary vasculature is normal. No consolidation, pleural effusion,
or pneumothorax. No acute osseous abnormalities are seen.
IMPRESSION: Clear lungs.  No acute pulmonary process.

## 2017-03-29 ENCOUNTER — Other Ambulatory Visit: Payer: Self-pay | Admitting: Obstetrics and Gynecology

## 2017-03-29 DIAGNOSIS — M545 Low back pain, unspecified: Secondary | ICD-10-CM

## 2017-03-30 ENCOUNTER — Ambulatory Visit
Admission: RE | Admit: 2017-03-30 | Discharge: 2017-03-30 | Disposition: A | Discharge status: Home / Self Care | Payer: PRIVATE HEALTH INSURANCE | Source: Ambulatory Visit | Attending: Obstetrics and Gynecology | Admitting: Obstetrics and Gynecology

## 2017-03-30 DIAGNOSIS — M545 Low back pain, unspecified: Secondary | ICD-10-CM

## 2017-06-04 ENCOUNTER — Other Ambulatory Visit: Payer: Self-pay | Admitting: Physician Assistant

## 2017-06-04 DIAGNOSIS — R160 Hepatomegaly, not elsewhere classified: Secondary | ICD-10-CM

## 2017-06-15 ENCOUNTER — Ambulatory Visit
Admission: RE | Admit: 2017-06-15 | Discharge: 2017-06-15 | Disposition: A | Discharge status: Home / Self Care | Payer: PRIVATE HEALTH INSURANCE | Source: Ambulatory Visit | Attending: Physician Assistant | Admitting: Physician Assistant

## 2017-06-15 DIAGNOSIS — R160 Hepatomegaly, not elsewhere classified: Secondary | ICD-10-CM

## 2017-07-26 ENCOUNTER — Other Ambulatory Visit: Payer: Self-pay

## 2017-07-26 DIAGNOSIS — I83893 Varicose veins of bilateral lower extremities with other complications: Secondary | ICD-10-CM

## 2017-08-31 ENCOUNTER — Encounter: Payer: PRIVATE HEALTH INSURANCE | Admitting: Vascular Surgery

## 2017-08-31 ENCOUNTER — Encounter (HOSPITAL_COMMUNITY): Payer: PRIVATE HEALTH INSURANCE

## 2017-10-29 DIAGNOSIS — E039 Hypothyroidism, unspecified: Secondary | ICD-10-CM | POA: Diagnosis not present

## 2017-11-28 DIAGNOSIS — R600 Localized edema: Secondary | ICD-10-CM | POA: Diagnosis not present

## 2017-11-30 DIAGNOSIS — N390 Urinary tract infection, site not specified: Secondary | ICD-10-CM | POA: Diagnosis not present

## 2017-12-09 DIAGNOSIS — N898 Other specified noninflammatory disorders of vagina: Secondary | ICD-10-CM | POA: Diagnosis not present

## 2018-01-16 DIAGNOSIS — H04129 Dry eye syndrome of unspecified lacrimal gland: Secondary | ICD-10-CM | POA: Diagnosis not present

## 2018-01-24 DIAGNOSIS — Z124 Encounter for screening for malignant neoplasm of cervix: Secondary | ICD-10-CM | POA: Diagnosis not present

## 2018-01-24 DIAGNOSIS — Z01419 Encounter for gynecological examination (general) (routine) without abnormal findings: Secondary | ICD-10-CM | POA: Diagnosis not present

## 2018-01-24 DIAGNOSIS — Z113 Encounter for screening for infections with a predominantly sexual mode of transmission: Secondary | ICD-10-CM | POA: Diagnosis not present

## 2018-01-24 DIAGNOSIS — Z1151 Encounter for screening for human papillomavirus (HPV): Secondary | ICD-10-CM | POA: Diagnosis not present

## 2018-03-02 DIAGNOSIS — N9489 Other specified conditions associated with female genital organs and menstrual cycle: Secondary | ICD-10-CM | POA: Diagnosis not present

## 2018-03-02 DIAGNOSIS — N904 Leukoplakia of vulva: Secondary | ICD-10-CM | POA: Diagnosis not present

## 2018-03-02 DIAGNOSIS — N398 Other specified disorders of urinary system: Secondary | ICD-10-CM | POA: Diagnosis not present

## 2018-03-02 DIAGNOSIS — R102 Pelvic and perineal pain: Secondary | ICD-10-CM | POA: Diagnosis not present

## 2018-03-05 DIAGNOSIS — E0789 Other specified disorders of thyroid: Secondary | ICD-10-CM | POA: Diagnosis not present

## 2018-03-05 DIAGNOSIS — H6123 Impacted cerumen, bilateral: Secondary | ICD-10-CM | POA: Diagnosis not present

## 2018-03-05 DIAGNOSIS — H6593 Unspecified nonsuppurative otitis media, bilateral: Secondary | ICD-10-CM | POA: Diagnosis not present

## 2018-03-07 DIAGNOSIS — F411 Generalized anxiety disorder: Secondary | ICD-10-CM | POA: Diagnosis not present

## 2018-03-07 DIAGNOSIS — F438 Other reactions to severe stress: Secondary | ICD-10-CM | POA: Diagnosis not present

## 2018-03-18 DIAGNOSIS — J029 Acute pharyngitis, unspecified: Secondary | ICD-10-CM | POA: Diagnosis not present

## 2018-03-18 DIAGNOSIS — J069 Acute upper respiratory infection, unspecified: Secondary | ICD-10-CM | POA: Diagnosis not present

## 2018-03-22 DIAGNOSIS — F438 Other reactions to severe stress: Secondary | ICD-10-CM | POA: Diagnosis not present

## 2018-03-22 DIAGNOSIS — F411 Generalized anxiety disorder: Secondary | ICD-10-CM | POA: Diagnosis not present

## 2018-05-03 DIAGNOSIS — E039 Hypothyroidism, unspecified: Secondary | ICD-10-CM | POA: Diagnosis not present

## 2021-01-09 ENCOUNTER — Other Ambulatory Visit: Payer: Self-pay | Admitting: Sports Medicine

## 2021-01-09 DIAGNOSIS — M5459 Other low back pain: Secondary | ICD-10-CM

## 2021-01-21 ENCOUNTER — Other Ambulatory Visit: Payer: Self-pay

## 2021-01-21 ENCOUNTER — Ambulatory Visit
Admission: RE | Admit: 2021-01-21 | Discharge: 2021-01-21 | Disposition: A | Discharge status: Home / Self Care | Payer: 59 | Source: Ambulatory Visit | Attending: Sports Medicine | Admitting: Sports Medicine

## 2021-01-21 DIAGNOSIS — M5459 Other low back pain: Secondary | ICD-10-CM

## 2021-01-31 ENCOUNTER — Other Ambulatory Visit: Payer: Self-pay | Admitting: Sports Medicine

## 2021-01-31 DIAGNOSIS — M25562 Pain in left knee: Secondary | ICD-10-CM

## 2021-02-24 ENCOUNTER — Encounter: Payer: Self-pay | Admitting: Neurology

## 2021-02-24 ENCOUNTER — Ambulatory Visit: Payer: 59 | Admitting: Neurology

## 2021-02-24 VITALS — BP 146/90 | HR 90 | Ht 67.0 in | Wt 196.4 lb

## 2021-02-24 DIAGNOSIS — G5732 Lesion of lateral popliteal nerve, left lower limb: Secondary | ICD-10-CM

## 2021-02-24 NOTE — Patient Instructions (Addendum)
Sounds like a peroneal neuropathy MRI knee Saturday Can repeat emg/ncs with more studies to further examine Transposition of peroneal nerve? Sometimes can be done.  [facility] MRI knee Mychart me after talk to Dr. Delilah Shan and if emg/ncs is something you want to go forward with again let us know Old L5/S1 issues   Peroneal Nerve Entrapment Common peroneal nerve entrapment is a condition that can make it hard to lift a foot. The condition results from pressure on a nerve in the lower leg called the common peroneal nerve. Your common peroneal nerve provides feeling to your outer lower leg and foot. It also supplies the muscles that move your foot and toes upward and outward. What are the causes? This condition may be caused by: Sitting cross-legged, squatting, or kneeling for long periods of time. A hard, direct hit to the outside of the lower leg. Swelling from a knee injury. A break or fracture in one of the lower leg bones. Wearing a boot or cast that ends just below the knee. A growth or cyst near the nerve. What increases the risk? This condition is more likely to develop in people who play: Contact sports, such as football or hockey. Sports where you wear high and stiff boots, such as skiing. What are the signs or symptoms? Symptoms of this condition include: Trouble lifting your foot up (foot drop). Tripping often. Your foot hitting the ground harder than normal as you walk, resulting in a slapping-type sound. Numbness, tingling, or pain in the outside of the knee, outside of the lower leg, and top of the foot. Sensitivity to pressure on the front or outside of the leg. How is this diagnosed? This condition may be diagnosed based on: Your symptoms. Your medical history. A physical exam. Tests, such as: X-rays to check the bones of your knee and leg. MRI to check tendons that attach to the side of your knee. Ultrasound to check for a growth or cyst. Electromyogram  (EMG) to check your nerves. During your physical exam, your health care provider will check for numbness in your leg and test the strength of your lower leg muscles. He or she may tap the side of your lower leg to see if that causes tingling. How is this treated? Treatment for this condition may include: Avoiding activities that make symptoms worse. Using a brace to hold up your foot and toes. Taking anti-inflammatory pain medicines to relieve swelling and lessen pain. Having medicines injected into your ankle joint to lessen pain and swelling. Doing exercises to help you regain or maintain movement (physical therapy). Surgery to take pressure off the nerve. This may be needed if there is no improvement after 2-3 months or if there is a growth pushing on the nerve. Returning gradually to full activity. Follow these instructions at home: If you have a removable brace: Wear the brace as told by your health care provider. Remove it only as told by your health care provider. Check the skin around the brace every day. Tell your health care provider about any concerns. Loosen the brace if your toes tingle, become numb, or turn cold and blue. Keep the brace clean. If the brace is not waterproof: Do not let it get wet. Cover it with a watertight covering when you take a bath or a shower. Activity Ask your health care provider when it is safe to drive if you have a brace on your foot. Do not do any activities that make pain or swelling worse.  Do exercises as told by your health care provider. Return to your normal activities as told by your health care provider. Ask your health care provider what activities are safe for you. General instructions Take over-the-counter and prescription medicines only as told by your health care provider. Do not put your full weight on your knee until your health care provider says you can. Use crutches as directed by your health care provider. Keep all follow-up  visits. This is important. How is this prevented? Wear supportive footwear that is appropriate for your athletic activity. Avoid athletic activities that cause ankle pain or swelling. Wear protective padding over your lower legs when playing contact sports. Make sure your boots do not put extra pressure on the area just below your knees. Do not sit cross-legged for long periods of time. Contact a health care provider if: Your symptoms do not get better in 2-3 months. The weakness or numbness in your leg or foot gets worse. Summary Common peroneal nerve entrapment is a condition that results from pressure on a nerve in the lower leg called the common peroneal nerve. This condition may be caused by a hard hit, swelling, a fracture, or a cyst in the lower leg. Treatment may include rest, a brace, medicines, and physical therapy. Sometimes surgery is needed. Do not do any activities that make pain or swelling worse. This information is not intended to replace advice given to you by your health care provider. Make sure you discuss any questions you have with your health care provider. Document Revised: 10/02/2020 Document Reviewed: 10/02/2020 Elsevier Patient Education  2022 [employer].

## 2021-02-24 NOTE — Progress Notes (Signed)
[facility]    Provider:  Dr Jaynee Eagles Requesting Provider: Pedro Earls, MD Primary Care Provider:  Jefm Petty, MD  CC:  pain in the left knee  HPI:  Andrea Gallegos is a 41 y.o. female here as requested by Pedro Earls, MD for peroneal cyst and peroneal neuropathy.  I reviewed Dr. Pedro Earls notes, patient has numbness and tingling that radiates into her left lower leg, doing better since her last visit, she reports that she has pain in the knee along with some numbness and tingling in the left leg she is already had nerve conduction studies and further discussion of treatment options, she continues to have worsening symptoms in her left leg, she reports pain, numbness and tingling, it seems to worsen the left lower leg, she does feel some weakness, this has been going on for several months.  Vickki Hearing ordered an MRI of the lumbar spine.  He also ordered an MRI of the knee.  And sent her to neurology.  Per notes, there is electrodiagnostic evidence of a chronic left lumbosacral radiculopathy affecting the left L5 and S1 nerve roots.  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  She had a meniscal tear of her right knee.  She is done 4 weeks of chiropractic care.  Symptoms are numbness and tingling in the knee that radiates down towards her ankle area.  Dr. Delilah Shan thought that after work-up it is mostly coming from her lumbar spine and her right knee does show mechanical type symptomatology, she is already had MRIs and EMG nerve conduction studies.  Starts at the knee and goes down to the foot. Started 3.5 months ago. She was walking and she noticed it happen acutely. Also pain when he walks, before she had the MRI lumbar sine. It hurts  badly when she walks, she crosses her legs a lot, worse when she walks, she has to ice it and elevate it, she has to raise her legs up, she wears great shoes, only symptoms in the left leg, the right leg is fine, she has had back pain for 13 years diagnosed with fibromyalgia. Getting worse. No other focal neurologic deficits, associated symptoms, inciting events or modifiable factors.  IMPRESSION: MRI reviewed images and agree 1. No acute findings or explanation for the patient's symptoms. 2. Mild disc bulging at L2-3 and L3-4 without resulting spinal stenosis or nerve root encroachment. 3. Mild facet hypertrophy inferiorly. 4. T2 hyperintense lesion posteriorly in the right hepatic lobe, corresponding with a hemangioma on previous imaging.  I reviewed outside EMG/NCS results/data/waveforms, the peroneal motor nerve recording at the EDB and the tibial motor nerves were abnormal, I did not see that the peroneal motor nerve was recorded at the tibialis anterior which is often where I see peroneal neuropathies, I also do not see where there any peroneal sensory nerves completed either.  There were some chronic changes in L5-S1 muscles, remote L5-S1 radiculopathy could also cause the tibial motor and peroneal motor abnormality conductions.  The sural sensory's were normal.  I think if were looking for peroneal neuropathy there is more that could be done on the EMG nerve conduction study.  Reviewed notes, labs and imaging from outside physicians, which showed: See above  Review of Systems: Patient complains of  symptoms per HPI as well as the following symptoms left leg pain. Pertinent negatives and positives per HPI. All others negative.   Social History   Socioeconomic History   Marital status: Married    Spouse name: Not on file   Number of children: Not on file   Years of education: Not on file   Highest education level: Not on file  Occupational History   Not on file  Tobacco Use    Smoking status: Former    Packs/day: 0.25    Years: 15.00    Pack years: 3.75    Types: Cigarettes    Quit date: 2016    Years since quitting: 7.0   Smokeless tobacco: Never  Vaping Use   Vaping Use: Never used  Substance and Sexual Activity   Alcohol use: No    Comment: last drink June 2015-- previously 6 beers 2x per week randomly   Drug use: No   Sexual activity: Not on file  Other Topics Concern   Not on file  Social History Narrative   Not on file   Social Determinants of Health   Financial Resource Strain: Not on file  Food Insecurity: Not on file  Transportation Needs: Not on file  Physical Activity: Not on file  Stress: Not on file  Social Connections: Not on file  Intimate Partner Violence: Not on file    Family History  Problem Relation Age of Onset   Cancer Mother        squamous cell   Depression Mother    Depression Maternal Aunt    Cancer Paternal Uncle    Cancer Maternal Grandfather    Diabetes Maternal Grandfather    Cancer Paternal Grandmother    Cancer Paternal Grandfather    Depression Cousin    Suicidality Neg Hx    Neuropathy Neg Hx     Past Medical History:  Diagnosis Date   Bipolar disorder ([place])    Cancer ([place])    basal cell cancer   Depression    Fibromyalgia    Liver mass    Varicose veins     Patient Active Problem List   Diagnosis Date Noted   Thyroid mass 06/22/2014   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   MDD (major depressive disorder) 08/02/2013   Varicose veins of lower extremities with other complications 26/94/8546   Pain in limb 05/10/2012   Unspecified venous (peripheral) insufficiency 05/10/2012   Smoking 01/04/2011   Palpitations 01/04/2011   Chest pain 01/04/2011    Past Surgical History:  Procedure Laterality Date   BIOPSY THYROID     BLADDER SURGERY     at age 8   CESAREAN SECTION     X 2   DILATION AND CURETTAGE OF UTERUS     ENDOVENOUS ABLATION SAPHENOUS VEIN W/ LASER Left 06-12-2009    left greater saphenous vein   ENDOVENOUS ABLATION SAPHENOUS VEIN W/ LASER Right 07-10-2009   right greater saphenous vein   THYROIDECTOMY Left 06/22/2014   Procedure: THYROIDECTOMY;  Surgeon: Melissa Montane, MD;  Location[facility];  Service: ENT;  Laterality: Left;   TUBAL LIGATION      Current Outpatient Medications  Medication Sig Dispense Refill   FENUGREEK PO Take by mouth daily.     HYDROXYZINE HCL PO Take by mouth as needed.     Omega-3 Fatty Acids (FISH OIL PO) Take by mouth daily. 2 TABLETS  DAILY     sertraline (ZOLOFT) 50 MG tablet Take 50 mg  by mouth daily.     SYNTHROID 75 MCG tablet Take 75 mcg by mouth daily.     UNABLE TO FIND Nerve Vitamin     VITAMIN D PO Take by mouth.     No current facility-administered medications for this visit.    Allergies as of 02/24/2021 - Review Complete 02/24/2021  Allergen Reaction Noted   Bacitracin Hives and Itching 05/02/2013   Other Other (See Comments) 08/03/2013   Neosporin [neomycin-polymyxin-gramicidin] Rash 01/01/2011    Vitals: BP (!) 146/90    Pulse 90    Ht 5\' 7"  (1.702 m)    Wt 196 lb 6.4 oz (89.1 kg)    BMI 30.76 kg/m  Last Weight:  Wt Readings from Last 1 Encounters:  02/24/21 196 lb 6.4 oz (89.1 kg)   Last Height:   Ht Readings from Last 1 Encounters:  02/24/21 5\' 7"  (1.702 m)     Physical exam: Exam: Gen: NAD, conversant, well nourised, obese, well groomed                     CV: RRR, no MRG. No Carotid Bruits. No peripheral edema, warm, nontender Eyes: Conjunctivae clear without exudates or hemorrhage  Neuro: Detailed Neurologic Exam  Speech:    Speech is normal; fluent and spontaneous with normal comprehension.  Cognition:    The patient is oriented to person, place, and time;     recent and remote memory intact;     language fluent;     normal attention, concentration,     fund of knowledge Cranial Nerves:    The pupils are equal, round, and reactive to light. Pupils too small to visualize fundi.  Visual fields are full to finger confrontation. Extraocular movements are intact. Trigeminal sensation is intact and the muscles of mastication are normal. The face is symmetric. The palate elevates in the midline. Hearing intact. Voice is normal. Shoulder shrug is normal. The tongue has normal motion without fasciculations.   Coordination:    Normal    Gait:    Normal, not steppage, can walk on heels.  Motor Observation:    No asymmetry, no atrophy, and no involuntary movements noted. Tone:    Normal muscle tone.    Posture:    Posture is normal. normal erect    Strength: Possibly some mild decrease in left dorsiflexion and  eversion otherwise Strength is V/V in the upper and lower limbs.      Sensation: Decreased top of foot     Reflex Exam:  DTR's:    Deep tendon reflexes in the upper and lower extremities are normal bilaterally.   Toes:    The toes are downgoing bilaterally.   Clonus:    Clonus is absent.    Assessment/Plan: This is a patient sent by Dr. Vickki Hearing.  EMG nerve conduction study showed chronic lumbar radiculopathy however MRI of the lumbar spine did not show any signs of current or ongoing L5/s1 radic to explain her left leg symptoms.  Sounds like peroneal neuropathy to me, I discussed this with patient, it is a little complicated since she may have remote symptoms of L5-S1 radic per outside EMG nerve conduction study but she has pain on the left side of her knee with radiation down to the top of her foot.  -May be peroneal neuropathy.  Outside EMG nerve conduction study did not check peroneal sensory conductions and neither did it check for drop in velocity when recording at the tibialis anterior  across the fibular head.  If MRI of the knee this Saturday does not show any etiology I would repeat a limited EMG nerve conduction study including peroneal sensory, peroneal motor from both the EDB and tib ant.  -I discussed that before we will proceed with the EMG  nerve conduction study, she would need to discuss the MRI of the knee which is a Saturday with Dr. Delilah Shan.  If nothing is found to explain her symptoms on that, then we can proceed with an limited EMG nerve conduction study.  -I also discussed if this is peroneal neuropathy, conservative measures, physical therapy, and to discuss with Dr. Vickki Hearing if he provides peroneal nerve transposition and if not to recommend due to someone who does or can evaluate you for it.  No orders of the defined types were placed in this encounter.  No orders of the defined types were placed in this encounter.   Cc: Pedro Earls, MD,  Jefm Petty, MD  Sarina Ill, MD  [facility] [address] [place], [place] [address]  Phone [phone] Fax [phone]  I spent over 75 minutes of face-to-face and non-face-to-face time with patient on the  1. Neuropathy of left peroneal nerve    diagnosis.  This included previsit chart review, lab review, study review, order entry, electronic health record documentation, patient education on the different diagnostic and therapeutic options, counseling and coordination of care, risks and benefits of management, compliance, or risk factor reduction

## 2021-03-01 ENCOUNTER — Ambulatory Visit
Admission: RE | Admit: 2021-03-01 | Discharge: 2021-03-01 | Disposition: A | Discharge status: Home / Self Care | Payer: 59 | Source: Ambulatory Visit | Attending: Sports Medicine | Admitting: Sports Medicine

## 2021-03-01 ENCOUNTER — Other Ambulatory Visit: Payer: Self-pay

## 2021-03-01 DIAGNOSIS — M25562 Pain in left knee: Secondary | ICD-10-CM

## 2021-03-05 ENCOUNTER — Encounter: Payer: Self-pay | Admitting: Neurology

## 2021-03-17 ENCOUNTER — Ambulatory Visit: Payer: 59 | Admitting: Neurology

## 2022-04-20 ENCOUNTER — Ambulatory Visit: Payer: Managed Care, Other (non HMO)

## 2022-04-20 ENCOUNTER — Ambulatory Visit: Payer: Managed Care, Other (non HMO) | Admitting: Podiatry

## 2022-04-20 DIAGNOSIS — M79671 Pain in right foot: Secondary | ICD-10-CM

## 2022-04-20 DIAGNOSIS — M79672 Pain in left foot: Secondary | ICD-10-CM

## 2022-04-20 DIAGNOSIS — G629 Polyneuropathy, unspecified: Secondary | ICD-10-CM | POA: Diagnosis not present

## 2022-04-20 NOTE — Progress Notes (Unsigned)
Subjective:   Patient ID: Andrea Gallegos, female   DOB: 42 y.o.   MRN: [id number]   HPI Chief Complaint  Patient presents with   Foot Pain    Bilateral foot pain, started 7-8  yrs ago, does a lot of outdoor activities and can't pinpoint why pain occurs     This has been ongoing for a long time. She went to [employer]. She works at a substance above . By the 2nd hour she feels like her feet are on fire, mostly in the toes. It does not wake her up but it does take time to go to sleep.    ROS      Objective:  Physical Exam  ***     Assessment:  ***     Plan:  ***    -Check cost for nerve biopsy

## 2022-04-20 NOTE — Patient Instructions (Signed)
You can start a B-Complex vitamin and also alpha lipoic acid  We will check insurance coverage for the nerve biopsy and let you know.

## 2022-08-20 ENCOUNTER — Encounter (HOSPITAL_BASED_OUTPATIENT_CLINIC_OR_DEPARTMENT_OTHER): Payer: Self-pay

## 2022-08-20 ENCOUNTER — Emergency Department (HOSPITAL_BASED_OUTPATIENT_CLINIC_OR_DEPARTMENT_OTHER): Payer: Managed Care, Other (non HMO)

## 2022-08-20 ENCOUNTER — Emergency Department (HOSPITAL_BASED_OUTPATIENT_CLINIC_OR_DEPARTMENT_OTHER)
Admission: EM | Admit: 2022-08-20 | Discharge: 2022-08-21 | Disposition: A | Discharge status: Home / Self Care | Payer: Managed Care, Other (non HMO) | Attending: Emergency Medicine | Admitting: Emergency Medicine

## 2022-08-20 DIAGNOSIS — R21 Rash and other nonspecific skin eruption: Secondary | ICD-10-CM

## 2022-08-20 LAB — CBC WITH DIFFERENTIAL/PLATELET
Abs Immature Granulocytes: 0.02 10*3/uL (ref 0.00–0.07)
Basophils Absolute: 0 10*3/uL (ref 0.0–0.1)
Basophils Relative: 0 %
Eosinophils Absolute: 0 10*3/uL (ref 0.0–0.5)
Eosinophils Relative: 1 %
HCT: 40.9 % (ref 36.0–46.0)
Hemoglobin: 14.1 g/dL (ref 12.0–15.0)
Immature Granulocytes: 0 %
Lymphocytes Relative: 17 %
Lymphs Abs: 1.3 10*3/uL (ref 0.7–4.0)
MCH: 30.7 pg (ref 26.0–34.0)
MCHC: 34.5 g/dL (ref 30.0–36.0)
MCV: 88.9 fL (ref 80.0–100.0)
Monocytes Absolute: 0.6 10*3/uL (ref 0.1–1.0)
Monocytes Relative: 7 %
Neutro Abs: 5.6 10*3/uL (ref 1.7–7.7)
Neutrophils Relative %: 75 %
Platelets: 231 10*3/uL (ref 150–400)
RBC: 4.6 MIL/uL (ref 3.87–5.11)
RDW: 12.8 % (ref 11.5–15.5)
WBC: 7.6 10*3/uL (ref 4.0–10.5)
nRBC: 0 % (ref 0.0–0.2)

## 2022-08-20 NOTE — ED Provider Notes (Addendum)
[place] EMERGENCY DEPARTMENT AT [facility] Provider Note   CSN: [id number] Arrival date & time: 08/20/22  2152     History Chief Complaint  Patient presents with   Rash    HPI Andrea Gallegos is a 42 y.o. female presenting for very atypical rash. She is otherwise healthy 42 year old female who noticed a rash that popped up all throughout her body after hiking.  No tick was found does not recall any obvious bites or stings. Is following with her PCP who has tried prednisone and doxycycline (only 7 days so far) without any interval improvement. Has had interval worsening.  Has diffuse maculopapular rash of the right anterior olecranon, bilateral medial shins, left anterior chest underneath her left breast and on her bilateral neck.  Lesions are nonpruritic nor painful.  She does not have any mucosal involvement.  She denies fevers chills but endorses pharyngeal symptoms, cough, malaise.   Patient's recorded medical, surgical, social, medication list and allergies were reviewed in the Snapshot window as part of the initial history.   Review of Systems   Review of Systems  Constitutional:  Positive for activity change, fatigue and fever. Negative for chills.  HENT:  Negative for ear pain and sore throat.   Eyes:  Negative for pain and visual disturbance.  Respiratory:  Positive for cough. Negative for shortness of breath.   Cardiovascular:  Negative for chest pain and palpitations.  Gastrointestinal:  Negative for abdominal pain and vomiting.  Genitourinary:  Negative for dysuria and hematuria.  Musculoskeletal:  Negative for arthralgias and back pain.  Skin:  Positive for rash. Negative for color change.  Neurological:  Negative for seizures and syncope.  All other systems reviewed and are negative.   Physical Exam Updated Vital Signs BP (!) 159/51   Pulse 94   Temp 98.6 F (37 C)   Resp 12   Ht 5\' 7"  (1.702 m)   Wt 83 kg   SpO2 100%   BMI 28.66 kg/m   Physical Exam Vitals and nursing note reviewed.  Constitutional:      General: She is not in acute distress.    Appearance: She is well-developed.  HENT:     Head: Normocephalic and atraumatic.  Eyes:     Conjunctiva/sclera: Conjunctivae normal.  Cardiovascular:     Rate and Rhythm: Normal rate and regular rhythm.     Heart sounds: No murmur heard. Pulmonary:     Effort: Pulmonary effort is normal. No respiratory distress.     Breath sounds: Normal breath sounds.  Abdominal:     General: There is no distension.     Palpations: Abdomen is soft.     Tenderness: There is no abdominal tenderness. There is no right CVA tenderness or left CVA tenderness.  Musculoskeletal:        General: No swelling or tenderness. Normal range of motion.     Cervical back: Neck supple.  Skin:    General: Skin is warm and dry.     Findings: Rash (Multiple lesions.  Vesicular appearing lesions on her anterior shins, erythematous maculopapular rash to right anterior upper arm, similar lesions on left chest, underneath left breast, bilateral base of neck.  These lesions do not fluoresce under UV.) present.  Neurological:     General: No focal deficit present.     Mental Status: She is alert and oriented to person, place, and time. Mental status is at baseline.     Cranial Nerves: No cranial nerve deficit.  ED Course/ Medical Decision Making/ A&P    Procedures Procedures   Medications Ordered in ED Medications - No data to display  Medical Decision Making:    Andrea Gallegos is a 42 y.o. female who presented to the ED today with atypical rash detailed above.    Complete initial physical exam performed, notably the patient  was hemodynamically stable in no acute distress.      Reviewed and confirmed nursing documentation for past medical history, family history, social history.    Initial Assessment:   Patient's rash is very nonspecific.  Given recent hiking, ehrlichiosis, Lyme  disease, [place] spotted fever would all be on the differential especially given her diffuse syndrome.  However she is already on therapy for with doxycycline.  Autoimmune pathology such as dermatomyositis, dermatitis herpetiformis would additionally be on the differential, however patient has minimal underlying history for provocation. Contact dermatitis is also at the top of the differential given hiking, duration of symptoms and appearance of vesicular lesions especially on the shins.  States that her hike did take her deep into the woods and that her dog was rolling in the leaves. Told patient that my primary treatment would likely be continuation of doxycycline, administration of a beta-hemolytic strep active agent such as Keflex and full treatment dose steroid taper for contact dermatitis.  However I am hesitant to perform all of these interventions when patient has follow-up with a subspecialist within 10 hours. The initial chest x-ray and EKG are nondiagnostic and blood work is pending at time of handoff to oncoming team. Patient felt comfortable waiting for subspecialist provider recommendations and will plan to take the lab work to them for secondary opinion and ultimate therapeutic intervention. Care patient is handed off to Dr. Earle Gell pending results of lab work.  Anticipate that there will be no emergent findings and patient will be able to be discharged to follow-up with primary/dermatology  Addendum: Labs resulted. Normal. Will DC with recommended therapies though she should follow up with dermatology for more targeted recommendations.  Disposition:  I have considered need for hospitalization, however, considering all of the above, I believe this patient is stable for discharge at this time.  Patient/family educated about specific return precautions for given chief complaint and symptoms.  Patient/family educated about follow-up with PCP.     Patient/family expressed understanding of  return precautions and need for follow-up. Patient spoken to regarding all imaging and laboratory results and appropriate follow up for these results. All education provided in verbal form with additional information in written form. Time was allowed for answering of patient questions. Patient discharged.    Emergency Department Medication Summary:   Medications - No data to display      Clinical Impression:  1. Rash      Discharge   Final Clinical Impression(s) / ED Diagnoses Final diagnoses:  Rash    Rx / DC Orders ED Discharge Orders          Ordered    doxycycline (VIBRAMYCIN) 100 MG capsule  2 times daily        08/21/22 0014    cephALEXin (KEFLEX) 500 MG capsule  3 times daily        08/21/22 0014    predniSONE (DELTASONE) 10 MG tablet  Q breakfast        08/21/22 0014              Glyn Ade, MD 08/21/22 Marlyne Beards    Glyn Ade, MD 08/21/22 [id number]

## 2022-08-20 NOTE — ED Triage Notes (Signed)
Patient here POV from Home.  Endorses Rash that began Sunday. Located in multiple areas such as Right Arm, Torso, Bilateral legs. Itchy somewhat. No Confirmed Fevers.   Has been taking Doxycyline recently for Bronchitis. Finished Doxycycline 1 Week ago. Has been taking prednisone as well.   NAD Noted during Triage. A&Ox4. GCS 15. Ambulatory.

## 2022-08-21 LAB — BASIC METABOLIC PANEL
Anion gap: 8 (ref 5–15)
BUN: 16 mg/dL (ref 6–20)
CO2: 28 mmol/L (ref 22–32)
Calcium: 9.3 mg/dL (ref 8.9–10.3)
Chloride: 102 mmol/L (ref 98–111)
Creatinine, Ser: 0.65 mg/dL (ref 0.44–1.00)
GFR, Estimated: >60 mL/min (ref >60)
Glucose, Bld: 92 mg/dL (ref 70–99)
Potassium: 3.6 mmol/L (ref 3.5–5.1)
Sodium: 138 mmol/L (ref 135–145)

## 2022-08-21 LAB — HEPATIC FUNCTION PANEL
ALT: 18 U/L (ref 0–44)
AST: 14 U/L — ABNORMAL LOW (ref 15–41)
Albumin: 4.2 g/dL (ref 3.5–5.0)
Alkaline Phosphatase: 81 U/L (ref 38–126)
Bilirubin, Direct: 0.1 mg/dL (ref 0.0–0.2)
Indirect Bilirubin: 0.4 mg/dL (ref 0.3–0.9)
Total Bilirubin: 0.5 mg/dL (ref 0.3–1.2)
Total Protein: 7.8 g/dL (ref 6.5–8.1)

## 2022-08-21 MED ORDER — DOXYCYCLINE HYCLATE 100 MG PO CAPS: 100.0000 mg | ORAL_CAPSULE | Freq: Two times a day (BID) | ORAL | 0 refills | Status: AC

## 2022-08-21 MED ORDER — PREDNISONE 10 MG PO TABS: ORAL_TABLET | ORAL | 0 refills | Status: AC

## 2022-08-21 MED ORDER — CEPHALEXIN 500 MG PO CAPS: 500.0000 mg | ORAL_CAPSULE | Freq: Three times a day (TID) | ORAL | 0 refills | Status: AC

## 2022-08-21 NOTE — Discharge Instructions (Signed)
It was a pleasure taking care of you tonight. Your rash is concerning, we discussed that it could be infectious, inflammatory, autoimmune.  If you are not seeing a dermatologist tomorrow I would have started you on antibiotics and high-dose steroids, however given your ability to get a specialist follow-up in the a.m., it would be more appropriate for you to follow-up with the specialist. If your appointment falls through or you have any complication in the interim, please feel free to return for further care and management. Note that the only hospital in the area with on-call dermatology is going to be a tertiary care center such as [facility] or [place] or [place]. Thank for the opportunity to participate in your care, Glyn Ade MD

## 2022-08-21 NOTE — ED Notes (Signed)
 RN reviewed discharge instructions with pt. Pt verbalized understanding and had no further questions. VSS upon discharge.  

## 2022-12-03 ENCOUNTER — Other Ambulatory Visit (HOSPITAL_COMMUNITY): Payer: Self-pay

## 2022-12-29 ENCOUNTER — Other Ambulatory Visit (HOSPITAL_COMMUNITY): Payer: Self-pay

## 2022-12-29 MED ORDER — LISDEXAMFETAMINE DIMESYLATE 40 MG PO CAPS
40.0000 mg | ORAL_CAPSULE | Freq: Every morning | ORAL | 0 refills | Status: AC
  Filled 2022-12-29 – 2022-12-30 (×2): qty 30, 30d supply, fill #0

## 2022-12-30 ENCOUNTER — Other Ambulatory Visit (HOSPITAL_COMMUNITY): Payer: Self-pay

## 2022-12-30 ENCOUNTER — Other Ambulatory Visit: Payer: Self-pay

## 2023-01-04 ENCOUNTER — Other Ambulatory Visit (HOSPITAL_COMMUNITY): Payer: Self-pay

## 2023-01-04 MED ORDER — LISDEXAMFETAMINE DIMESYLATE 50 MG PO CAPS
50.0000 mg | ORAL_CAPSULE | Freq: Every morning | ORAL | 0 refills | Status: AC
  Filled 2023-01-04: qty 30, 30d supply, fill #0

## 2023-01-11 ENCOUNTER — Other Ambulatory Visit: Payer: Self-pay

## 2023-01-11 ENCOUNTER — Other Ambulatory Visit (HOSPITAL_COMMUNITY): Payer: Self-pay

## 2023-01-11 MED ORDER — NICOTINE 14 MG/24HR TD PT24
14.0000 mg | MEDICATED_PATCH | TRANSDERMAL | 0 refills | Status: DC
  Filled 2023-01-11: qty 28, 28d supply, fill #0

## 2023-01-11 MED ORDER — BUPROPION HCL ER (SR) 150 MG PO TB12
150.0000 mg | ORAL_TABLET | Freq: Every day | ORAL | 0 refills | Status: DC
  Filled 2023-01-11: qty 30, 30d supply, fill #0

## 2023-01-15 ENCOUNTER — Other Ambulatory Visit (HOSPITAL_COMMUNITY): Payer: Self-pay

## 2023-01-15 MED ORDER — BUPROPION HCL ER (SR) 150 MG PO TB12
150.0000 mg | ORAL_TABLET | Freq: Every day | ORAL | 2 refills | Status: AC
  Filled 2023-01-15 – 2023-02-01 (×3): qty 30, 30d supply, fill #0

## 2023-01-23 ENCOUNTER — Other Ambulatory Visit (HOSPITAL_COMMUNITY): Payer: Self-pay

## 2023-02-01 ENCOUNTER — Other Ambulatory Visit (HOSPITAL_COMMUNITY): Payer: Self-pay

## 2023-02-01 ENCOUNTER — Other Ambulatory Visit: Payer: Self-pay

## 2023-02-05 ENCOUNTER — Other Ambulatory Visit (HOSPITAL_COMMUNITY): Payer: Self-pay

## 2023-02-12 ENCOUNTER — Other Ambulatory Visit (HOSPITAL_COMMUNITY): Payer: Self-pay

## 2023-02-12 MED ORDER — BUPROPION HCL ER (SR) 150 MG PO TB12
150.0000 mg | ORAL_TABLET | Freq: Every day | ORAL | 2 refills | Status: AC
  Filled 2023-02-12: qty 30, 30d supply, fill #0

## 2023-02-12 MED ORDER — LISDEXAMFETAMINE DIMESYLATE 50 MG PO CAPS
50.0000 mg | ORAL_CAPSULE | Freq: Every morning | ORAL | 0 refills | Status: AC
  Filled 2023-02-12: qty 30, 30d supply, fill #0

## 2023-03-10 ENCOUNTER — Other Ambulatory Visit (HOSPITAL_COMMUNITY): Payer: Self-pay

## 2023-03-10 MED ORDER — NICOTINE 14 MG/24HR TD PT24
14.0000 mg | MEDICATED_PATCH | TRANSDERMAL | 0 refills | Status: AC
  Filled 2023-03-10: qty 28, 28d supply, fill #0

## 2023-03-10 MED ORDER — BUPROPION HCL 100 MG PO TABS
50.0000 mg | ORAL_TABLET | Freq: Every day | ORAL | 0 refills | Status: DC
  Filled 2023-03-10: qty 30, 60d supply, fill #0

## 2023-03-10 MED ORDER — LISDEXAMFETAMINE DIMESYLATE 50 MG PO CAPS
50.0000 mg | ORAL_CAPSULE | Freq: Every morning | ORAL | 0 refills | Status: AC
Start: 2023-03-10 — End: ?
  Filled 2023-03-10 – 2023-03-17 (×2): qty 30, 30d supply, fill #0

## 2023-03-10 MED ORDER — BUPROPION HCL ER (XL) 150 MG PO TB24
150.0000 mg | ORAL_TABLET | Freq: Every day | ORAL | 2 refills | Status: AC
  Filled 2023-03-10: qty 30, 30d supply, fill #0

## 2023-03-11 ENCOUNTER — Other Ambulatory Visit (HOSPITAL_COMMUNITY): Payer: Self-pay

## 2023-03-11 ENCOUNTER — Other Ambulatory Visit: Payer: Self-pay

## 2023-03-17 ENCOUNTER — Other Ambulatory Visit (HOSPITAL_COMMUNITY): Payer: Self-pay

## 2023-03-31 ENCOUNTER — Other Ambulatory Visit (HOSPITAL_COMMUNITY): Payer: Self-pay

## 2023-03-31 ENCOUNTER — Other Ambulatory Visit: Payer: Self-pay

## 2023-03-31 MED ORDER — LISDEXAMFETAMINE DIMESYLATE 50 MG PO CAPS
50.0000 mg | ORAL_CAPSULE | Freq: Every morning | ORAL | 0 refills | Status: AC
  Filled 2023-03-31 – 2023-05-31 (×2): qty 30, 30d supply, fill #0

## 2023-03-31 MED ORDER — BUPROPION HCL 100 MG PO TABS
250.0000 mg | ORAL_TABLET | Freq: Every day | ORAL | 1 refills | Status: AC
  Filled 2023-03-31: qty 75, 30d supply, fill #0

## 2023-04-03 ENCOUNTER — Other Ambulatory Visit (HOSPITAL_COMMUNITY): Payer: Self-pay

## 2023-04-03 MED ORDER — BUPROPION HCL ER (XL) 150 MG PO TB24
150.0000 mg | ORAL_TABLET | Freq: Every day | ORAL | 1 refills | Status: AC
  Filled 2023-04-03: qty 75, 75d supply, fill #0

## 2023-04-12 ENCOUNTER — Other Ambulatory Visit (HOSPITAL_COMMUNITY): Payer: Self-pay

## 2023-04-29 ENCOUNTER — Other Ambulatory Visit (HOSPITAL_COMMUNITY): Payer: Self-pay

## 2023-04-29 MED ORDER — BUPROPION HCL ER (XL) 150 MG PO TB24
150.0000 mg | ORAL_TABLET | Freq: Every day | ORAL | 1 refills | Status: AC
  Filled 2023-04-29: qty 75, 75d supply, fill #0

## 2023-04-29 MED ORDER — LISDEXAMFETAMINE DIMESYLATE 50 MG PO CAPS
50.0000 mg | ORAL_CAPSULE | Freq: Every morning | ORAL | 0 refills | Status: AC
  Filled 2023-04-29: qty 30, 30d supply, fill #0

## 2023-05-24 ENCOUNTER — Other Ambulatory Visit (HOSPITAL_COMMUNITY): Payer: Self-pay

## 2023-05-24 MED ORDER — LISDEXAMFETAMINE DIMESYLATE 50 MG PO CAPS
50.0000 mg | ORAL_CAPSULE | Freq: Every morning | ORAL | 0 refills | Status: AC
  Filled 2023-10-03: qty 30, 30d supply, fill #0

## 2023-05-31 ENCOUNTER — Other Ambulatory Visit (HOSPITAL_COMMUNITY): Payer: Self-pay

## 2023-06-12 ENCOUNTER — Other Ambulatory Visit (HOSPITAL_COMMUNITY): Payer: Self-pay

## 2023-06-12 MED ORDER — LISDEXAMFETAMINE DIMESYLATE 40 MG PO CAPS
40.0000 mg | ORAL_CAPSULE | Freq: Every morning | ORAL | 0 refills | Status: DC
Start: 2023-06-12 — End: 2023-06-26
  Filled 2023-06-12: qty 30, 30d supply, fill #0

## 2023-06-17 ENCOUNTER — Other Ambulatory Visit (HOSPITAL_COMMUNITY): Payer: Self-pay

## 2023-06-17 MED ORDER — BUPROPION HCL ER (XL) 150 MG PO TB24
150.0000 mg | ORAL_TABLET | Freq: Every day | ORAL | 0 refills | Status: AC
Start: 2023-06-17 — End: ?
  Filled 2023-06-17: qty 90, 90d supply, fill #0

## 2023-06-26 ENCOUNTER — Other Ambulatory Visit (HOSPITAL_COMMUNITY): Payer: Self-pay

## 2023-06-26 MED ORDER — GUANFACINE HCL 1 MG PO TABS
1.0000 mg | ORAL_TABLET | Freq: Every day | ORAL | 0 refills | Status: AC
  Filled 2023-06-26: qty 30, 30d supply, fill #0

## 2023-06-26 MED ORDER — LISDEXAMFETAMINE DIMESYLATE 40 MG PO CAPS: 40.0000 mg | ORAL_CAPSULE | Freq: Every morning | ORAL | 0 refills | Status: AC

## 2023-07-05 ENCOUNTER — Other Ambulatory Visit (HOSPITAL_COMMUNITY): Payer: Self-pay

## 2023-07-05 MED ORDER — LISDEXAMFETAMINE DIMESYLATE 40 MG PO CAPS
40.0000 mg | ORAL_CAPSULE | Freq: Every morning | ORAL | 0 refills | Status: AC
  Filled 2023-07-05 – 2023-07-10 (×2): qty 30, 30d supply, fill #0

## 2023-07-06 ENCOUNTER — Other Ambulatory Visit (HOSPITAL_COMMUNITY): Payer: Self-pay

## 2023-07-06 MED ORDER — BUPROPION HCL ER (XL) 150 MG PO TB24: 150.0000 mg | ORAL_TABLET | Freq: Every day | ORAL | 3 refills | Status: AC

## 2023-07-07 ENCOUNTER — Other Ambulatory Visit (HOSPITAL_COMMUNITY): Payer: Self-pay

## 2023-07-07 MED ORDER — AMPHETAMINE-DEXTROAMPHET ER 20 MG PO CP24
20.0000 mg | ORAL_CAPSULE | Freq: Every day | ORAL | 0 refills | Status: DC
Start: 2023-07-07 — End: 2023-07-07
  Filled 2023-07-07: qty 30, 30d supply, fill #0

## 2023-07-08 ENCOUNTER — Other Ambulatory Visit (HOSPITAL_COMMUNITY): Payer: Self-pay

## 2023-07-10 ENCOUNTER — Other Ambulatory Visit (HOSPITAL_COMMUNITY): Payer: Self-pay

## 2023-07-21 ENCOUNTER — Other Ambulatory Visit (HOSPITAL_COMMUNITY): Payer: Self-pay

## 2023-08-21 ENCOUNTER — Other Ambulatory Visit (HOSPITAL_COMMUNITY): Payer: Self-pay

## 2023-08-21 MED ORDER — BUPROPION HCL ER (XL) 150 MG PO TB24
150.0000 mg | ORAL_TABLET | Freq: Every day | ORAL | 3 refills | Status: AC
  Filled 2023-08-21 – 2023-10-03 (×3): qty 90, 90d supply, fill #0

## 2023-09-06 ENCOUNTER — Other Ambulatory Visit (HOSPITAL_COMMUNITY): Payer: Self-pay

## 2023-09-06 ENCOUNTER — Other Ambulatory Visit: Payer: Self-pay

## 2023-09-16 ENCOUNTER — Other Ambulatory Visit (HOSPITAL_COMMUNITY): Payer: Self-pay

## 2023-10-03 ENCOUNTER — Other Ambulatory Visit (HOSPITAL_COMMUNITY): Payer: Self-pay

## 2023-10-04 ENCOUNTER — Other Ambulatory Visit (HOSPITAL_COMMUNITY): Payer: Self-pay

## 2023-10-05 ENCOUNTER — Other Ambulatory Visit: Payer: Self-pay

## 2023-10-06 ENCOUNTER — Other Ambulatory Visit (HOSPITAL_COMMUNITY): Payer: Self-pay

## 2024-01-19 ENCOUNTER — Other Ambulatory Visit (HOSPITAL_COMMUNITY): Payer: Self-pay

## 2024-01-19 ENCOUNTER — Other Ambulatory Visit: Payer: Self-pay

## 2024-01-19 MED ORDER — BUPROPION HCL 100 MG PO TABS
100.0000 mg | ORAL_TABLET | Freq: Every day | ORAL | 3 refills | Status: AC
  Filled 2024-01-19: qty 90, 90d supply, fill #0

## 2024-01-28 ENCOUNTER — Other Ambulatory Visit (HOSPITAL_COMMUNITY): Payer: Self-pay
